# Patient Record
Sex: Male | Born: 1965 | Race: Black or African American | Hispanic: No | Marital: Single | Smoking: Never smoker
Health system: Southern US, Community
[De-identification: ages and names within clinical notes are randomized; demographics above are authoritative.]

## PROBLEM LIST (undated history)

## (undated) DIAGNOSIS — J45909 Unspecified asthma, uncomplicated: Secondary | ICD-10-CM

## (undated) DIAGNOSIS — F32A Depression, unspecified: Secondary | ICD-10-CM

## (undated) DIAGNOSIS — I10 Essential (primary) hypertension: Secondary | ICD-10-CM

## (undated) DIAGNOSIS — G4730 Sleep apnea, unspecified: Secondary | ICD-10-CM

## (undated) DIAGNOSIS — M1990 Unspecified osteoarthritis, unspecified site: Secondary | ICD-10-CM

## (undated) DIAGNOSIS — G43909 Migraine, unspecified, not intractable, without status migrainosus: Secondary | ICD-10-CM

## (undated) DIAGNOSIS — F329 Major depressive disorder, single episode, unspecified: Secondary | ICD-10-CM

## (undated) HISTORY — DX: Sleep apnea, unspecified: G47.30

## (undated) HISTORY — DX: Essential (primary) hypertension: I10

---

## 1999-05-13 ENCOUNTER — Emergency Department (HOSPITAL_COMMUNITY): Admission: EM | Admit: 1999-05-13 | Discharge: 1999-05-13 | Payer: Self-pay | Admitting: Emergency Medicine

## 2000-03-24 HISTORY — PX: UPPER GASTROINTESTINAL ENDOSCOPY: SHX188

## 2000-07-13 ENCOUNTER — Encounter: Payer: Self-pay | Admitting: Emergency Medicine

## 2000-07-13 ENCOUNTER — Emergency Department (HOSPITAL_COMMUNITY): Admission: EM | Admit: 2000-07-13 | Discharge: 2000-07-13 | Payer: Self-pay | Admitting: Emergency Medicine

## 2000-07-17 ENCOUNTER — Emergency Department (HOSPITAL_COMMUNITY): Admission: EM | Admit: 2000-07-17 | Discharge: 2000-07-17 | Payer: Self-pay | Admitting: Emergency Medicine

## 2000-07-17 ENCOUNTER — Encounter: Payer: Self-pay | Admitting: Emergency Medicine

## 2000-07-22 ENCOUNTER — Emergency Department (HOSPITAL_COMMUNITY): Admission: EM | Admit: 2000-07-22 | Discharge: 2000-07-22 | Payer: Self-pay | Admitting: Emergency Medicine

## 2000-07-24 ENCOUNTER — Encounter (INDEPENDENT_AMBULATORY_CARE_PROVIDER_SITE_OTHER): Payer: Self-pay | Admitting: Specialist

## 2000-07-24 ENCOUNTER — Ambulatory Visit (HOSPITAL_COMMUNITY): Admission: RE | Admit: 2000-07-24 | Discharge: 2000-07-24 | Payer: Self-pay | Admitting: Gastroenterology

## 2000-12-04 ENCOUNTER — Ambulatory Visit (HOSPITAL_COMMUNITY): Admission: RE | Admit: 2000-12-04 | Discharge: 2000-12-04 | Payer: Self-pay | Admitting: Gastroenterology

## 2002-07-29 ENCOUNTER — Emergency Department (HOSPITAL_COMMUNITY): Admission: EM | Admit: 2002-07-29 | Discharge: 2002-07-30 | Payer: Self-pay | Admitting: Emergency Medicine

## 2005-06-14 ENCOUNTER — Emergency Department (HOSPITAL_COMMUNITY): Admission: EM | Admit: 2005-06-14 | Discharge: 2005-06-15 | Payer: Self-pay | Admitting: Emergency Medicine

## 2005-10-06 ENCOUNTER — Emergency Department (HOSPITAL_COMMUNITY): Admission: EM | Admit: 2005-10-06 | Discharge: 2005-10-07 | Payer: Self-pay | Admitting: Emergency Medicine

## 2005-12-04 ENCOUNTER — Emergency Department (HOSPITAL_COMMUNITY): Admission: EM | Admit: 2005-12-04 | Discharge: 2005-12-05 | Payer: Self-pay | Admitting: Emergency Medicine

## 2006-01-15 ENCOUNTER — Emergency Department (HOSPITAL_COMMUNITY): Admission: EM | Admit: 2006-01-15 | Discharge: 2006-01-15 | Payer: Self-pay | Admitting: *Deleted

## 2006-04-20 ENCOUNTER — Emergency Department (HOSPITAL_COMMUNITY): Admission: EM | Admit: 2006-04-20 | Discharge: 2006-04-20 | Payer: Self-pay | Admitting: Emergency Medicine

## 2006-07-09 ENCOUNTER — Emergency Department (HOSPITAL_COMMUNITY): Admission: EM | Admit: 2006-07-09 | Discharge: 2006-07-09 | Payer: Self-pay | Admitting: Emergency Medicine

## 2008-10-12 ENCOUNTER — Encounter: Admission: RE | Admit: 2008-10-12 | Discharge: 2008-10-12 | Payer: Self-pay | Admitting: Internal Medicine

## 2009-08-15 ENCOUNTER — Emergency Department (HOSPITAL_COMMUNITY)
Admission: EM | Admit: 2009-08-15 | Discharge: 2009-08-15 | Payer: Self-pay | Source: Home / Self Care | Admitting: Emergency Medicine

## 2010-06-10 LAB — CBC
HCT: 39.9 % (ref 39.0–52.0)
Hemoglobin: 13.7 g/dL (ref 13.0–17.0)
MCHC: 34.3 g/dL (ref 30.0–36.0)
RBC: 4.29 MIL/uL (ref 4.22–5.81)
WBC: 6 10*3/uL (ref 4.0–10.5)

## 2010-06-10 LAB — DIFFERENTIAL
Eosinophils Absolute: 0.1 10*3/uL (ref 0.0–0.7)
Eosinophils Relative: 2 % (ref 0–5)
Lymphocytes Relative: 37 % (ref 12–46)
Lymphs Abs: 2.2 10*3/uL (ref 0.7–4.0)
Monocytes Relative: 8 % (ref 3–12)
Neutrophils Relative %: 53 % (ref 43–77)

## 2010-06-10 LAB — BASIC METABOLIC PANEL
BUN: 10 mg/dL (ref 6–23)
CO2: 29 mEq/L (ref 19–32)
GFR calc Af Amer: >60 mL/min (ref >60)
GFR calc non Af Amer: >60 mL/min (ref >60)
Glucose, Bld: 89 mg/dL (ref 70–99)
Potassium: 3.7 mEq/L (ref 3.5–5.1)

## 2010-06-10 LAB — POCT CARDIAC MARKERS: Troponin i, poc: <0.05 ng/mL (ref 0.00–0.09)

## 2010-08-09 NOTE — Procedures (Signed)
[place]. [facility]  Patient:    Corey Brooks, Corey Brooks                     MRN: [id number] Proc. Date: 07/24/00 Adm. Date:  [id number] Disc. Date: [id number] Attending:  Rich Brave                           Procedure Report  PROCEDURE:  Upper endoscopy with biopsies.  INDICATION:  A 45 year old gentleman who presented to the emergency room two nights ago with a history of melanic stools and weakness.  He was Hemoccult-positive but had a normal BUN and a hemoglobin of 9.4.  He had been on prednisone for asthma plus NSAIDs recently.  FINDINGS:  Active, dirty-based antral ulcer, not bleeding at time of exam.  DESCRIPTION OF PROCEDURE:  The nature, purpose, and risks of the procedure had been discussed with the patient, who provided written consent.  Sedation was fentanyl 50 mcg and Versed 5 mg IV without arrhythmias or desaturation.  The Olympus small-caliber adult video endoscope was passed under direct vision without difficulty.  The vocal cords were briefly seen and appeared grossly normal but were not well-studied.  The esophagus was endoscopically normal, without evidence of reflux esophagitis, Barretts esophagus, varices, infection, or neoplasia.  No ring, stricture, or hiatal hernia was seen.  The stomach was entered.  It contained no blood or coffee-grounds material. In the antrum in the immediate prepyloric region was a 1.5 cm shallow ulcer with a dirty base but no visible vessel.  Adjoining it was edematous, slightly erythematous mucosa.  The overall appearance was very benign.  The remainder of the stomach was normal, including a retroflex view of the proximal stomach.  The pylorus, duodenal bulb, and second duodenum looked normal.  Biopsies were obtained from the antrum prior to removal of the scope to look for evidence of H. pylori infection.  The patient tolerated the procedure well, and there were no  apparent complications.  IMPRESSION:  Active gastric ulcer without evidence of bleeding at this time, but constituting the presumed bleeding source to account for his recent melanic stools and drop in hemoglobin.  PLAN:  Continue Protonix.  Add Carafate for five days.  Await pathology results and treat if H. pylori positive.  Office follow-up as scheduled. DD:  07/24/00 TD:  07/27/00 Job: [id number]

## 2010-08-09 NOTE — Procedures (Signed)
[place]. [facility]  Patient:    Corey Brooks, Corey Brooks Visit Number: [id number] MRN: [id number]          Service Type: Attending:  Florencia Reasons, M.D. Dictated by:   Florencia Reasons, M.D. Proc. Date: 12/04/00   CC:         Sherrie Sport, M.D. [place], [place]   Procedure Report  PROCEDURE:  Upper endoscopy with biopsies.  INDICATIONS FOR PROCEDURE:  The patient is a 45 year old gentleman who is now four months status post an upper gastrointestinal bleed due to an antral ulcer which occurred in association with prednisone and NSAID therapy (the prednisone was for asthma).  At that time, the patient was noted to be H. pylori positive by biopsies, and he underwent treatment for that infection. He is now approximately two months status post completion of that treatment, and is not on any gastrointestinal tract medications, and is free of gastrointestinal symptoms.  FINDINGS:  Normal exam.  CLO test taken.  The nature and purpose for the procedure were familiar to the patient from prior examination.  He provided written consent.  SEDATION: 1. Phentanyl 70 mg IV without arrhythmias or desaturations. 2. Versed 7 mg IV without arrhythmias or desaturations.  DESCRIPTION OF PROCEDURE:  The Olympus small caliber adult video endoscope was passed under direct vision.  The vocal cords were not well seen, but the esophagus was fairly easily entered and had normal mucosa without evidence of reflux, esophagitis, Barretts esophagus, varices, infection, or neoplasia. No rings, stricture, or significant hiatal hernia was appreciated.  The stomach contained a small clear residual, and had entirely normal mucosa.  Specifically, the patients antral ulcer was totally healed, and without any obvious scarring.  No gastritis, erosions, ulcers, polyps, or masses were noted anywhere in the stomach, including a retroflexed view of the proximal stomach, and careful  inspection of the duodenum and bulb were unremarkable.  Antral biopsies were obtained x 2 to check for CLO testing.  This was to assess the ethicacy of his recent therapy for H. pylori infection.  The scope was then removed from the patient who tolerated the procedure well without apparent complication.  IMPRESSION:  Resolution of previous gastric ulcer.  PLAN:  Await CLO test results. Dictated by:   Florencia Reasons, M.D. Attending:  Florencia Reasons, M.D. DD:  12/04/00 TD:  12/04/00 Job: [id number]

## 2010-09-24 ENCOUNTER — Emergency Department (HOSPITAL_COMMUNITY)
Admission: EM | Admit: 2010-09-24 | Discharge: 2010-09-24 | Disposition: A | Discharge status: Home / Self Care | Payer: Self-pay | Attending: Emergency Medicine | Admitting: Emergency Medicine

## 2010-09-24 DIAGNOSIS — G43909 Migraine, unspecified, not intractable, without status migrainosus: Secondary | ICD-10-CM | POA: Insufficient documentation

## 2010-09-24 DIAGNOSIS — J45909 Unspecified asthma, uncomplicated: Secondary | ICD-10-CM | POA: Insufficient documentation

## 2014-11-29 ENCOUNTER — Emergency Department (HOSPITAL_COMMUNITY)
Admission: EM | Admit: 2014-11-29 | Discharge: 2014-11-30 | Disposition: A | Discharge status: Home / Self Care | Payer: 59 | Attending: Emergency Medicine | Admitting: Emergency Medicine

## 2014-11-29 ENCOUNTER — Emergency Department (HOSPITAL_COMMUNITY): Payer: 59

## 2014-11-29 ENCOUNTER — Encounter (HOSPITAL_COMMUNITY): Payer: Self-pay | Admitting: *Deleted

## 2014-11-29 DIAGNOSIS — Z8739 Personal history of other diseases of the musculoskeletal system and connective tissue: Secondary | ICD-10-CM | POA: Insufficient documentation

## 2014-11-29 DIAGNOSIS — Z7951 Long term (current) use of inhaled steroids: Secondary | ICD-10-CM | POA: Diagnosis not present

## 2014-11-29 DIAGNOSIS — R079 Chest pain, unspecified: Secondary | ICD-10-CM

## 2014-11-29 DIAGNOSIS — G43009 Migraine without aura, not intractable, without status migrainosus: Secondary | ICD-10-CM

## 2014-11-29 DIAGNOSIS — R0789 Other chest pain: Secondary | ICD-10-CM | POA: Diagnosis not present

## 2014-11-29 DIAGNOSIS — G43909 Migraine, unspecified, not intractable, without status migrainosus: Secondary | ICD-10-CM | POA: Diagnosis not present

## 2014-11-29 HISTORY — DX: Unspecified osteoarthritis, unspecified site: M19.90

## 2014-11-29 LAB — BASIC METABOLIC PANEL
Anion gap: 7 (ref 5–15)
CALCIUM: 9 mg/dL (ref 8.9–10.3)
CO2: 28 mmol/L (ref 22–32)
Chloride: 102 mmol/L (ref 101–111)
Creatinine, Ser: 1.12 mg/dL (ref 0.61–1.24)
GFR calc Af Amer: >60 mL/min (ref >60)
GLUCOSE: 97 mg/dL (ref 65–99)
POTASSIUM: 3.8 mmol/L (ref 3.5–5.1)
SODIUM: 137 mmol/L (ref 135–145)

## 2014-11-29 LAB — CBC
HEMATOCRIT: 40.9 % (ref 39.0–52.0)
Hemoglobin: 13.5 g/dL (ref 13.0–17.0)
MCH: 30.3 pg (ref 26.0–34.0)
MCHC: 33 g/dL (ref 30.0–36.0)
MCV: 91.7 fL (ref 78.0–100.0)
PLATELETS: 184 10*3/uL (ref 150–400)
RBC: 4.46 MIL/uL (ref 4.22–5.81)
RDW: 13.1 % (ref 11.5–15.5)
WBC: 4.5 10*3/uL (ref 4.0–10.5)

## 2014-11-29 LAB — I-STAT TROPONIN, ED: TROPONIN I, POC: 0 ng/mL (ref 0.00–0.08)

## 2014-11-29 MED ORDER — SUMATRIPTAN SUCCINATE 6 MG/0.5ML ~~LOC~~ SOLN
6.0000 mg | Freq: Once | SUBCUTANEOUS | Status: AC
  Administered 2014-11-29: 6 mg via SUBCUTANEOUS
  Filled 2014-11-29: qty 0.5

## 2014-11-29 NOTE — ED Notes (Signed)
Pt reports chest pain that started yesterday. Pt states that the pain has been worsening since then. States that the pain started while he was sitting. Pt also reports a migraine with hx of same.

## 2014-11-29 NOTE — ED Provider Notes (Signed)
CSN: [id number]     Arrival date & time 11/29/14  1752 History   First MD Initiated Contact with Patient 11/29/14 2138     Chief Complaint  Patient presents with  . Chest Pain  . Migraine     (Consider location/radiation/quality/duration/timing/severity/associated sxs/prior Treatment) HPI Comments: Pt comes in with c/o chest pain that started yesterday. He states that the pain has been consistent. No vomiting or diarrhea, fever or cough.no sob. He states that he is also having a migraine which is causing nausea and dizziness which is similar to his previous migraines. He normally takes imitrex but he is out. Pain in his chest is sharp and left sided. He states that the pain worsened when he was studying for a test.  The history is provided by the patient. No language interpreter was used.    Past Medical History  Diagnosis Date  . Arthritis    History reviewed. No pertinent past surgical history. No family history on file. Social History  Substance Use Topics  . Smoking status: Never Smoker   . Smokeless tobacco: None  . Alcohol Use: None    Review of Systems  All other systems reviewed and are negative.     Allergies  Asa  Home Medications   Prior to Admission medications   Medication Sig Start Date End Date Taking? Authorizing Provider  albuterol (PROVENTIL HFA;VENTOLIN HFA) 108 (90 BASE) MCG/ACT inhaler Inhale 1 puff into the lungs every 6 (six) hours as needed for wheezing or shortness of breath.   Yes Historical Provider, MD  beclomethasone (QVAR) 80 MCG/ACT inhaler Inhale 3 puffs into the lungs 2 (two) times daily.   Yes Historical Provider, MD  SUMAtriptan (IMITREX) 100 MG tablet Take 100 mg by mouth every 2 (two) hours as needed for migraine. May repeat in 2 hours if headache persists or recurs.   Yes Historical Provider, MD   BP 155/104 mmHg  Pulse 61  Temp(Src) 98.4 F (36.9 C) (Oral)  Resp 15  SpO2 99% Physical Exam  Constitutional: He is oriented to  person, place, and time. He appears well-developed and well-nourished.  HENT:  Head: Normocephalic and atraumatic.  Right Ear: External ear normal.  Left Ear: External ear normal.  Eyes: Conjunctivae and EOM are normal. Pupils are equal, round, and reactive to light.  Neck: Normal range of motion.  Cardiovascular: Normal rate and regular rhythm.   Pulmonary/Chest: Effort normal and breath sounds normal.  Left chest wall tender to palpation  Abdominal: Soft. Bowel sounds are normal. There is no tenderness.  Musculoskeletal: Normal range of motion.  Neurological: He is alert and oriented to person, place, and time. Coordination normal.  Skin: Skin is warm and dry.  Nursing note and vitals reviewed.   ED Course  Procedures (including critical care time) Labs Review Labs Reviewed  BASIC METABOLIC PANEL - Abnormal; Notable for the following:    BUN <5 (*)    All other components within normal limits  CBC  I-STAT TROPOININ, ED    Imaging Review Dg Chest 2 View  11/29/2014   CLINICAL DATA:  Headache and chest pain since 11/28/2014.  EXAM: CHEST  2 VIEW  COMPARISON:  CT chest and PA and lateral chest 08/15/2009.  FINDINGS: The lungs are clear. Heart size is normal. No pneumothorax or pleural effusion. No focal bony abnormality.  IMPRESSION: Negative chest.   Electronically Signed   By: Inge Rise M.D.   On: 11/29/2014 19:20   I have personally reviewed and  evaluated these images and lab results as part of my medical decision-making.   EKG Interpretation   Date/Time:  Wednesday November 29 2014 17:56:15 EDT Ventricular Rate:  76 PR Interval:  184 QRS Duration: 92 QT Interval:  390 QTC Calculation: 438 R Axis:   9 Text Interpretation:  Normal sinus rhythm Minimal voltage criteria for  LVH, may be normal variant Nonspecific T wave abnormality Abnormal ECG  Confirmed by MESNER MD, Corene Cornea [phone]) on 11/29/2014 10:25:57 PM      MDM   Final diagnoses:  Chest pain, unspecified  chest pain type  Nonintractable migraine, unspecified migraine type    Pts cp and migraine have resolved with imitrex. Doubt acs in nature. No red flag symptoms with migraine. Pt is okay to follow up with pcp if symptoms return    Glendell Docker, NP 11/29/14 2344  Merrily Pew, MD 11/30/14 9470

## 2014-11-29 NOTE — Discharge Instructions (Signed)
Chest Pain (Nonspecific) °It is often hard to give a specific diagnosis for the cause of chest pain. There is always a chance that your pain could be related to something serious, such as a heart attack or a blood clot in the lungs. You need to follow up with your health care provider for further evaluation. °CAUSES  °· Heartburn. °· Pneumonia or bronchitis. °· Anxiety or stress. °· Inflammation around your heart (pericarditis) or lung (pleuritis or pleurisy). °· A blood clot in the lung. °· A collapsed lung (pneumothorax). It can develop suddenly on its own (spontaneous pneumothorax) or from trauma to the chest. °· Shingles infection (herpes zoster virus). °The chest wall is composed of bones, muscles, and cartilage. Any of these can be the source of the pain. °· The bones can be bruised by injury. °· The muscles or cartilage can be strained by coughing or overwork. °· The cartilage can be affected by inflammation and become sore (costochondritis). °DIAGNOSIS  °Lab tests or other studies may be needed to find the cause of your pain. Your health care provider may have you take a test called an ambulatory electrocardiogram (ECG). An ECG records your heartbeat patterns over a 24-hour period. You may also have other tests, such as: °· Transthoracic echocardiogram (TTE). During echocardiography, sound waves are used to evaluate how blood flows through your heart. °· Transesophageal echocardiogram (TEE). °· Cardiac monitoring. This allows your health care provider to monitor your heart rate and rhythm in real time. °· Holter monitor. This is a portable device that records your heartbeat and can help diagnose heart arrhythmias. It allows your health care provider to track your heart activity for several days, if needed. °· Stress tests by exercise or by giving medicine that makes the heart beat faster. °TREATMENT  °· Treatment depends on what may be causing your chest pain. Treatment may include: °¨ Acid blockers for  heartburn. °¨ Anti-inflammatory medicine. °¨ Pain medicine for inflammatory conditions. °¨ Antibiotics if an infection is present. °· You may be advised to change lifestyle habits. This includes stopping smoking and avoiding alcohol, caffeine, and chocolate. °· You may be advised to keep your head raised (elevated) when sleeping. This reduces the chance of acid going backward from your stomach into your esophagus. °Most of the time, nonspecific chest pain will improve within 2-3 days with rest and mild pain medicine.  °HOME CARE INSTRUCTIONS  °· If antibiotics were prescribed, take them as directed. Finish them even if you start to feel better. °· For the next few days, avoid physical activities that bring on chest pain. Continue physical activities as directed. °· Do not use any tobacco products, including cigarettes, chewing tobacco, or electronic cigarettes. °· Avoid drinking alcohol. °· Only take medicine as directed by your health care provider. °· Follow your health care provider's suggestions for further testing if your chest pain does not go away. °· Keep any follow-up appointments you made. If you do not go to an appointment, you could develop lasting (chronic) problems with pain. If there is any problem keeping an appointment, call to reschedule. °SEEK MEDICAL CARE IF:  °· Your chest pain does not go away, even after treatment. °· You have a rash with blisters on your chest. °· You have a fever. °SEEK IMMEDIATE MEDICAL CARE IF:  °· You have increased chest pain or pain that spreads to your arm, neck, jaw, back, or abdomen. °· You have shortness of breath. °· You have an increasing cough, or you cough   up blood.  You have severe back or abdominal pain.  You feel nauseous or vomit.  You have severe weakness.  You faint.  You have chills. This is an emergency. Do not wait to see if the pain will go away. Get medical help at once. Call your local emergency services (911 in [place] Do not drive  yourself to the hospital. MAKE SURE YOU:   Understand these instructions.  Will watch your condition.  Will get help right away if you are not doing well or get worse. Document Released: 12/18/2004 Document Revised: 03/15/2013 Document Reviewed: 10/14/2007 [facility] Patient Information 2015 [place], [place]. This information is not intended to replace advice given to you by your health care provider. Make sure you discuss any questions you have with your health care provider.  Migraine Headache A migraine headache is very bad, throbbing pain on one or both sides of your head. Talk to your doctor about what things may bring on (trigger) your migraine headaches. HOME CARE  Only take medicines as told by your doctor.  Lie down in a dark, quiet room when you have a migraine.  Keep a journal to find out if certain things bring on migraine headaches. For example, write down:  What you eat and drink.  How much sleep you get.  Any change to your diet or medicines.  Lessen how much alcohol you drink.  Quit smoking if you smoke.  Get enough sleep.  Lessen any stress in your life.  Keep lights dim if bright lights bother you or make your migraines worse. GET HELP RIGHT AWAY IF:   Your migraine becomes really bad.  You have a fever.  You have a stiff neck.  You have trouble seeing.  Your muscles are weak, or you lose muscle control.  You lose your balance or have trouble walking.  You feel like you will pass out (faint), or you pass out.  You have really bad symptoms that are different than your first symptoms. MAKE SURE YOU:   Understand these instructions.  Will watch your condition.  Will get help right away if you are not doing well or get worse. Document Released: 12/18/2007 Document Revised: 06/02/2011 Document Reviewed: 11/15/2012 [facility] Patient Information 2015 [place], [place]. This information is not intended to replace advice given to you by your health care  provider. Make sure you discuss any questions you have with your health care provider.

## 2015-07-05 ENCOUNTER — Emergency Department (HOSPITAL_COMMUNITY)
Admission: EM | Admit: 2015-07-05 | Discharge: 2015-07-07 | Disposition: A | Discharge status: Home / Self Care | Payer: BLUE CROSS/BLUE SHIELD | Attending: Emergency Medicine | Admitting: Emergency Medicine

## 2015-07-05 ENCOUNTER — Encounter (HOSPITAL_COMMUNITY): Payer: Self-pay

## 2015-07-05 DIAGNOSIS — Z79899 Other long term (current) drug therapy: Secondary | ICD-10-CM | POA: Diagnosis not present

## 2015-07-05 DIAGNOSIS — R45851 Suicidal ideations: Secondary | ICD-10-CM | POA: Diagnosis present

## 2015-07-05 DIAGNOSIS — F332 Major depressive disorder, recurrent severe without psychotic features: Secondary | ICD-10-CM | POA: Insufficient documentation

## 2015-07-05 DIAGNOSIS — Z8739 Personal history of other diseases of the musculoskeletal system and connective tissue: Secondary | ICD-10-CM | POA: Diagnosis not present

## 2015-07-05 HISTORY — DX: Depression, unspecified: F32.A

## 2015-07-05 HISTORY — DX: Major depressive disorder, single episode, unspecified: F32.9

## 2015-07-05 LAB — COMPREHENSIVE METABOLIC PANEL
ALT: 15 U/L — AB (ref 17–63)
ANION GAP: 6 (ref 5–15)
AST: 20 U/L (ref 15–41)
Albumin: 3.7 g/dL (ref 3.5–5.0)
Alkaline Phosphatase: 41 U/L (ref 38–126)
BUN: 11 mg/dL (ref 6–20)
CO2: 27 mmol/L (ref 22–32)
Calcium: 8.6 mg/dL — ABNORMAL LOW (ref 8.9–10.3)
Chloride: 105 mmol/L (ref 101–111)
Creatinine, Ser: 1.23 mg/dL (ref 0.61–1.24)
GFR calc non Af Amer: >60 mL/min (ref >60)
Glucose, Bld: 106 mg/dL — ABNORMAL HIGH (ref 65–99)
POTASSIUM: 4.1 mmol/L (ref 3.5–5.1)
SODIUM: 138 mmol/L (ref 135–145)
Total Bilirubin: 0.6 mg/dL (ref 0.3–1.2)
Total Protein: 6.8 g/dL (ref 6.5–8.1)

## 2015-07-05 LAB — CBC
HCT: 40.4 % (ref 39.0–52.0)
HEMOGLOBIN: 13.9 g/dL (ref 13.0–17.0)
MCH: 30.7 pg (ref 26.0–34.0)
MCHC: 34.4 g/dL (ref 30.0–36.0)
MCV: 89.2 fL (ref 78.0–100.0)
PLATELETS: 210 10*3/uL (ref 150–400)
RBC: 4.53 MIL/uL (ref 4.22–5.81)
RDW: 12.9 % (ref 11.5–15.5)
WBC: 6.1 10*3/uL (ref 4.0–10.5)

## 2015-07-05 LAB — ETHANOL

## 2015-07-05 LAB — RAPID URINE DRUG SCREEN, HOSP PERFORMED
AMPHETAMINES: NOT DETECTED
Barbiturates: NOT DETECTED
Benzodiazepines: NOT DETECTED
COCAINE: NOT DETECTED
OPIATES: NOT DETECTED
TETRAHYDROCANNABINOL: NOT DETECTED

## 2015-07-05 LAB — SALICYLATE LEVEL

## 2015-07-05 LAB — ACETAMINOPHEN LEVEL

## 2015-07-05 MED ORDER — LORAZEPAM 1 MG PO TABS: 1.0000 mg | ORAL_TABLET | Freq: Three times a day (TID) | ORAL | Status: DC | PRN

## 2015-07-05 MED ORDER — ONDANSETRON HCL 4 MG PO TABS: 4.0000 mg | ORAL_TABLET | Freq: Three times a day (TID) | ORAL | Status: DC | PRN

## 2015-07-05 MED ORDER — ALUM & MAG HYDROXIDE-SIMETH 200-200-20 MG/5ML PO SUSP: 30.0000 mL | ORAL | Status: DC | PRN

## 2015-07-05 MED ORDER — ACETAMINOPHEN 325 MG PO TABS: 650.0000 mg | ORAL_TABLET | ORAL | Status: DC | PRN

## 2015-07-05 NOTE — BH Assessment (Addendum)
Tele Assessment Note   Corey Brooks is an 50 y.o.single male was referred to [facility] by the [place] at [facility] after he told a lab assistant in one of his classes that he was not feeling well.  Per pt, lab assistant referred pt to [facility] at [facility]. Pt sts he has been increasingly depressed over the last few months since he was laid off from his FT job.  Pt sts that for the last week his depression has been getting worse and over the last week he began to think about killing himself by jumping off the roof of a high building. Pt sts that hs has been under stress from FT work & school for "quite a while."  Pt denies HI, SHI and AH.  Pt sts that at times he feels a "spirit" behind him and sometimes believes he sees something out of the corner of his eye.  Pt sts he often feels as if he is being followed.   Pt sts that his primary stressor currently is school and having been laid off from work.  Pt sts he sleeps on about 4-5 hours per night and prior to being laid off from his job only slept about 2 hours per night. Pt sts "I've been sleep deprived for a very long time."  Symptoms of depression include deep sadness, fatigue, excessive guilt, decreased self esteem, tearfulness & crying spells, self isolation, lack of motivation for activities and pleasure, irritability, negative outlook, difficulty thinking & concentrating, feeling helpless and hopeless, sleep and eating disturbances. Pt denies any symptoms of anxiety stating "I'm usually very calm."  Pt sts he lives alone and although feels supported emotionally by his aging mother he tends to isolate himself from most people. Pt sts his has an older brother has been diagnosed with Schizophrenia many years ago. Pt sts he has no psychiatric hospitalizations, no current psychiatrist or therapist.  Pt sts he did see a therapist in [place] for about 3-4 months in 2003 or 2004 due to depression and he was prescribed Zoloft at that time. Pt sts he is  no longer prescribed any psychiatric medications. Pt denies any past or current legal issues.  Pt denies any alcohol use or recreational drug use. When tested in the [place] tonight pt's BAL is <5 and UDS is incomplete at the time of this assessment. 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 Pt sts he has already completed an Associate's degree. Pt denies an hx of abuse.  Pt sts he performs ADLs independently.   Pt was dressed in scrubs and sitting on his hospital bed. Pt was alert, cooperative and pleasant. Pt kept fair eye contact, spoke in a clear tone and at normal pace. Pt moved in a normal manner when moving. Pt's thought process was coherent and relevant and judgement was impaired.  Pt's mood was stated to be depressed but not anxious and his flat affect was congruent.  Pt was oriented x 4, to person, place, time and situation.   Diagnosis: 296.33  MDD, recurrent, severe  Past Medical History:  Past Medical History  Diagnosis Date  . Arthritis   . Depression     History reviewed. No pertinent past surgical history.  Family History: History reviewed. No pertinent family history.  Social History:  reports that he has never smoked. He does not have any smokeless tobacco history on file. He reports that he does not  drink alcohol. His drug history is not on file.  Additional Social History:  Alcohol / Drug Use Prescriptions: See PTA list History of alcohol / drug use?: No history of alcohol / drug abuse  CIWA: CIWA-Ar BP: (!) 147/105 mmHg Pulse Rate: 90 COWS:    PATIENT STRENGTHS: (choose at least two) Ability for insight Capable of independent living Communication skills Supportive family/friends  Allergies:  Allergies  Allergen Reactions  . Asa [Aspirin] Other (See Comments)    Stomach ulcer    Home Medications:  (Not in a hospital admission)  OB/GYN Status:  No LMP for male patient.  General Assessment  Data Location of Assessment: [facility] ED TTS Assessment: In system Is this a Tele or Face-to-Face Assessment?: Tele Assessment Is this an Initial Assessment or a Re-assessment for this encounter?: Initial Assessment Marital status: Single Maiden name: na Is patient pregnant?: No Pregnancy Status: No Living Arrangements: Alone Can pt return to current living arrangement?: Yes Admission Status: Voluntary Is patient capable of signing voluntary admission?: Yes Referral Source: Other ([place]) Insurance type: [place] Screening Exam ([place]) Medical Exam completed: Yes  Crisis Care Plan Living Arrangements: Alone Name of Psychiatrist: none (once had a psychiatrist in [place]) Name of Therapist: None  Education Status Is patient currently in school?: Yes Current Grade: na Highest grade of school patient has completed: 12 (Associate Degree) Name of school: [employer] person: na  Risk to self with the past 6 months Suicidal Ideation: Yes-Currently Present Has patient been a risk to self within the past 6 months prior to admission? : Yes Suicidal Intent: Yes-Currently Present Has patient had any suicidal intent within the past 6 months prior to admission? : Yes Is patient at risk for suicide?: Yes Suicidal Plan?: Yes-Currently Present Has patient had any suicidal plan within the past 6 months prior to admission? : Yes Specify Current Suicidal Plan: plan to jump off a high roof Access to Means: Yes What has been your use of drugs/alcohol within the last 12 months?: none Previous Attempts/Gestures: No How many times?: 0 Other Self Harm Risks: none (denies) Triggers for Past Attempts: Unpredictable Intentional Self Injurious Behavior: None Family Suicide History: Yes (MH- Brother with Schizophrenia) Recent stressful life event(s): Turmoil (Comment) (Working FT and laid off last month & FT student) Persecutory voices/beliefs?: Yes Depression:  Yes Depression Symptoms: Insomnia, Tearfulness, Isolating, Fatigue, Guilt, Loss of interest in usual pleasures, Feeling worthless/self pity, Feeling angry/irritable Substance abuse history and/or treatment for substance abuse?: No Suicide prevention information given to non-admitted patients: Not applicable  Risk to Others within the past 6 months Homicidal Ideation: No (denies) Does patient have any lifetime risk of violence toward others beyond the six months prior to admission? : No Thoughts of Harm to Others: No (denies) Current Homicidal Intent: No Current Homicidal Plan: No Access to Homicidal Means: No Identified Victim: na History of harm to others?: No (denies) Assessment of Violence: None Noted (denies) Violent Behavior Description: na Does patient have access to weapons?: No (denies) Criminal Charges Pending?: No (denies) Does patient have a court date: No Is patient on probation?: No  Psychosis Hallucinations: Visual (thinks see "spirits" out of corner of eye; follow him) Delusions: None noted  Mental Status Report Appearance/Hygiene: Disheveled, In scrubs Eye Contact: Fair Motor Activity: Freedom of movement, Unremarkable Speech: Logical/coherent, Soft, Slow Level of Consciousness: Quiet/awake Mood: Depressed, Anxious, Pleasant (sts not usually anxious) Affect: Flat, Appropriate to circumstance Anxiety Level: Minimal Thought Processes: Coherent, Relevant  Judgement: Impaired Orientation: Person, Place, Situation, Time Obsessive Compulsive Thoughts/Behaviors: None  Cognitive Functioning Concentration: Poor Memory: Recent Intact, Remote Intact IQ: Average Insight: Fair Impulse Control: Fair Appetite: Fair Weight Loss: 0 (sts appetite has decreased) Weight Gain: 0 Sleep: Decreased (sts due to stress & schedule w work & school) Total Hours of Sleep: 2 (now that laid off sts gets 4-5 hours) Vegetative Symptoms: Decreased grooming, Not bathing  ADLScreening  [facility] Assessment Services) Patient's cognitive ability adequate to safely complete daily activities?: Yes Patient able to express need for assistance with ADLs?: Yes Independently performs ADLs?: Yes (appropriate for developmental age)  Prior Inpatient Therapy Prior Inpatient Therapy: No Prior Therapy Dates: na Prior Therapy Facilty/Provider(s): na Reason for Treatment: na  Prior Outpatient Therapy Prior Outpatient Therapy: Yes Prior Therapy Dates: stopped quite a while ago-  (seeing him in 2003 or 2004- saw her 3-4 mos) Prior Therapy Facilty/Provider(s): Dr. Percell Miller in [facility] Reason for Treatment: Depression Does patient have an ACCT team?: No Does patient have Intensive In-House Services?  : No Does patient have [facility] services? : No Does patient have P4CC services?: No  ADL Screening (condition at time of admission) Patient's cognitive ability adequate to safely complete daily activities?: Yes Patient able to express need for assistance with ADLs?: Yes Independently performs ADLs?: Yes (appropriate for developmental age)       Abuse/Neglect Assessment (Assessment to be complete while patient is alone) Physical Abuse: Denies Verbal Abuse: Denies Sexual Abuse: Denies Exploitation of patient/patient's resources: Denies Self-Neglect: Denies     Regulatory affairs officer (For Healthcare) Does patient have an advance directive?: No Would patient like information on creating an advanced directive?: No - patient declined information    Additional Information 1:1 In Past 12 Months?: No CIRT Risk: No Elopement Risk: No Does patient have medical clearance?: Yes     Disposition:  Disposition Initial Assessment Completed for this Encounter: Yes Disposition of Patient: Other dispositions (Pending review w [facility] Provider or Extender) Other disposition(s): Other (Comment)  Per Dr. Parke Poisson: Recommend pt be re-evaluated by psychiatry in the morning.   Spoke with Dr. Dayna Barker, [place] at  [place] of recommendation. He voiced agreement.   Faylene Kurtz, MS, CRC, [place] Triage Specialist [facility] T 07/05/2015 10:20 PM

## 2015-07-05 NOTE — BHH Counselor (Addendum)
Called to ask if pt medically cleared for [facility] Assessment (no provider note in EPIC) & to possibly set up TA.  Nurse stated she was not sure if medically cleared.  Asked that TTS Consult order be discontinued until pt is medically cleared. She advised she would consult EDP.   Faylene Kurtz, MS, CRC, [place] Triage Specialist [facility]

## 2015-07-05 NOTE — ED Notes (Signed)
Pt has been in school taking a full load, he's not been on any depression medication and began to get depressed and suicidal, he wanted to jump off the roof, he finally told someone and they referred him here and said if he got help they would reinstate his financial aid and allow him back in school. Pt said he was taking zoloft before and thought it helped

## 2015-07-05 NOTE — ED Provider Notes (Signed)
CSN: [id number]     Arrival date & time 07/05/15  1818 History   First MD Initiated Contact with Patient 07/05/15 2011     Chief Complaint  Patient presents with  . Depression  . Suicidal     (Consider location/radiation/quality/duration/timing/severity/associated sxs/prior Treatment) Patient is a 50 y.o. male presenting with depression.  Depression This is a recurrent problem. The current episode started more than 1 week ago. The problem occurs constantly. The problem has been gradually worsening. Pertinent negatives include no chest pain, no headaches and no shortness of breath. Nothing aggravates the symptoms. Nothing relieves the symptoms. He has tried nothing for the symptoms. The treatment provided no relief.    Past Medical History  Diagnosis Date  . Arthritis   . Depression    History reviewed. No pertinent past surgical history. History reviewed. No pertinent family history. Social History  Substance Use Topics  . Smoking status: Never Smoker   . Smokeless tobacco: None  . Alcohol Use: No    Review of Systems  Constitutional: Negative for chills.  Respiratory: Negative for chest tightness and shortness of breath.   Cardiovascular: Negative for chest pain.  Endocrine: Negative for polydipsia and polyuria.  Genitourinary: Negative for dysuria and flank pain.  Neurological: Negative for headaches.  Psychiatric/Behavioral: Positive for depression and suicidal ideas. Negative for behavioral problems, confusion and self-injury.  All other systems reviewed and are negative.     Allergies  Asa  Home Medications   Prior to Admission medications   Medication Sig Start Date End Date Taking? Authorizing Provider  albuterol (PROVENTIL HFA;VENTOLIN HFA) 108 (90 BASE) MCG/ACT inhaler Inhale 1 puff into the lungs every 6 (six) hours as needed for wheezing or shortness of breath.   Yes Historical Provider, MD  beclomethasone (QVAR) 80 MCG/ACT inhaler Inhale 3 puffs into the  lungs 2 (two) times daily.   Yes Historical Provider, MD  hydrochlorothiazide (HYDRODIURIL) 12.5 MG tablet Take 12.5 mg by mouth daily.   Yes Historical Provider, MD  loratadine (CLARITIN) 10 MG tablet Take 10 mg by mouth daily.   Yes Historical Provider, MD  montelukast (SINGULAIR) 10 MG tablet Take 10 mg by mouth daily.   Yes Historical Provider, MD  SUMAtriptan (IMITREX) 100 MG tablet Take 100 mg by mouth every 2 (two) hours as needed for migraine. May repeat in 2 hours if headache persists or recurs.   Yes Historical Provider, MD   BP 147/105 mmHg  Pulse 90  Temp(Src) 98.7 F (37.1 C) (Oral)  Resp 16  SpO2 95% Physical Exam  Constitutional: He is oriented to person, place, and time. He appears well-developed and well-nourished.  HENT:  Head: Normocephalic and atraumatic.  Neck: Normal range of motion.  Cardiovascular: Normal rate.   Pulmonary/Chest: Effort normal. No respiratory distress.  Abdominal: Soft. He exhibits no distension. There is no tenderness.  Musculoskeletal: Normal range of motion.  Neurological: He is alert and oriented to person, place, and time. No cranial nerve deficit.  Skin: Skin is warm and dry.  Psychiatric: He expresses suicidal ideation. He expresses suicidal plans.  Nursing note and vitals reviewed.   ED Course  Procedures (including critical care time) Labs Review Labs Reviewed  COMPREHENSIVE METABOLIC PANEL - Abnormal; Notable for the following:    Glucose, Bld 106 (*)    Calcium 8.6 (*)    ALT 15 (*)    All other components within normal limits  ACETAMINOPHEN LEVEL - Abnormal; Notable for the following:    Acetaminophen (Tylenol),  Serum <10 (*)    All other components within normal limits  ETHANOL  SALICYLATE LEVEL  CBC  URINE RAPID DRUG SCREEN, HOSP PERFORMED    Imaging Review No results found. I have personally reviewed and evaluated these images and lab results as part of my medical decision-making.   EKG Interpretation None       MDM   Final diagnoses:  None   No medical issues. Worsening depression and suicidal thoughts/ideation with a plan.  Medically cleared for TTS consultation.      Merrily Pew, MD 07/05/15 [phone]

## 2015-07-06 DIAGNOSIS — F332 Major depressive disorder, recurrent severe without psychotic features: Secondary | ICD-10-CM

## 2015-07-06 MED ORDER — SERTRALINE HCL 50 MG PO TABS
50.0000 mg | ORAL_TABLET | Freq: Every day | ORAL | Status: DC
  Administered 2015-07-06 – 2015-07-07 (×2): 50 mg via ORAL
  Filled 2015-07-06 (×2): qty 1

## 2015-07-06 MED ORDER — HYDROCHLOROTHIAZIDE 12.5 MG PO CAPS
12.5000 mg | ORAL_CAPSULE | Freq: Every day | ORAL | Status: DC
  Administered 2015-07-06 – 2015-07-07 (×2): 12.5 mg via ORAL
  Filled 2015-07-06 (×3): qty 1

## 2015-07-06 NOTE — ED Notes (Signed)
Psychiatry at bedside.

## 2015-07-06 NOTE — ED Notes (Signed)
Pt belongings moved to locker number 35

## 2015-07-06 NOTE — BHH Counselor (Signed)
This Probation officer submitted supporting documentation to the following psychiatric facilities for patient placement: [facility], [facility], [facility], [facility], [place]

## 2015-07-06 NOTE — ED Notes (Signed)
Patient noted in room. No complaints, stable, in no acute distress. Q15 minute rounds and monitoring via [employer] Cam[employer] to continue.  

## 2015-07-06 NOTE — ED Notes (Addendum)
Pt has a blunt affect and appears depressed this am. He speaks in a soft voice and told the writer he is taking courses at [employer] to decide if he wants to go into medicine. Pt remains with a sitter.he has not been able to sleep due to the pt next to him being very loud and verbal all night. Pt remains with a sitter and is pleasant and cooperative. (10:30am )

## 2015-07-06 NOTE — Consult Note (Signed)
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 Brooks patient admitted with Depression, suicidal ideation  HPI:  Corey Brooks, 50 years old was evaluated for increased feelings of depression and suicidal ideation with plan to jump off the roof.  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  He is a full time student and works full time too.  Patient was seen for Depression in 2003-2004 and was placed on Zoloft.  Patient states he was taking Zoloft but stopped when he started feeling better.  Patient states that he started feeling depressed again and started feeling suicidal in the past one week.  Patient reports poor sleep and appetite.  He has been accepted for admission and we will be seeking placement at any facility with available bed.  Past Psychiatric History: MDD  Risk to Self: Suicidal Ideation: Yes-Currently Present Suicidal Intent: Yes-Currently Present Is patient at risk for suicide?: Yes Suicidal Plan?: Yes-Currently Present Specify Current Suicidal Plan: plan to jump off a high roof Access to Means: Yes What has been your use of drugs/alcohol within the last 12 months?: none How many times?: 0 Other Self Harm Risks: none (denies) Triggers for Past Attempts: Unpredictable Intentional Self Injurious Behavior: None Risk to Others: Homicidal Ideation: No (denies) Thoughts of Harm to Others: No (denies) Current Homicidal Intent: No Current Homicidal Plan: No Access to Homicidal Means: No Identified Victim: na History of harm to others?: No  (denies) Assessment of Violence: None Noted (denies) Violent Behavior Description: na Does patient have access to weapons?: No (denies) Criminal Charges Pending?: No (denies) Does patient have a court date: No Prior Inpatient Therapy: Prior Inpatient Therapy: No Prior Therapy Dates: na Prior Therapy Facilty/Provider(s): na Reason for Treatment: na Prior Outpatient Therapy: Prior Outpatient Therapy: Yes Prior Therapy Dates: stopped quite a while ago-  (seeing him in 2003 or 2004- saw her 3-4 mos) Prior Therapy Facilty/Provider(s): Dr. Percell Miller in [facility] Reason for Treatment: Depression Does patient have an ACCT team?: No Does patient have Intensive In-House Services?  : No Does patient have [facility] services? : No Does patient have P4CC services?: No  Past Medical History:  Past Medical History  Diagnosis Date  . Arthritis   . Depression    History reviewed. No pertinent past surgical history. Family History: History reviewed. No pertinent family history.   Family Psychiatric  History: Denies  Social History:  History  Alcohol Use No     History  Drug Use Not on file    Social History   Social History  . Marital Status: Single    Spouse Name: N/A  . Number of Children: N/A  . Years of Education: N/A   Social History Main Topics  . Smoking status: Never Smoker   . Smokeless tobacco: None  . Alcohol Use: No  . Drug Use: None  . Sexual Activity: Not Asked   Other Topics Concern  . None   Social History Narrative   Additional Social History:    Allergies:   Allergies  Allergen Reactions  . Asa [Aspirin] Other (See Comments)    Stomach ulcer  Labs:  Results for orders placed or performed during the hospital encounter of 07/05/15 (from the past 48 hour(s))  Comprehensive metabolic panel     Status: Abnormal   Collection Time: 07/05/15  7:59 PM  Result Value Ref Range   Sodium 138 135 - 145 mmol/L   Potassium 4.1 3.5 - 5.1 mmol/L    Comment: SLIGHT  HEMOLYSIS   Chloride 105 101 - 111 mmol/L   CO2 27 22 - 32 mmol/L   Glucose, Bld 106 (H) 65 - 99 mg/dL   BUN 11 6 - 20 mg/dL   Creatinine, Ser 1.23 0.61 - 1.24 mg/dL   Calcium 8.6 (L) 8.9 - 10.3 mg/dL   Total Protein 6.8 6.5 - 8.1 g/dL   Albumin 3.7 3.5 - 5.0 g/dL   AST 20 15 - 41 U/L   ALT 15 (L) 17 - 63 U/L   Alkaline Phosphatase 41 38 - 126 U/L   Total Bilirubin 0.6 0.3 - 1.2 mg/dL   GFR calc non Af Amer >60 >60 mL/min   GFR calc Af Amer >60 >60 mL/min    Comment: (NOTE) The eGFR has been calculated using the CKD EPI equation. This calculation has not been validated in all clinical situations. eGFR's persistently <60 mL/min signify possible Chronic Kidney Disease.    Anion gap 6 5 - 15  Ethanol (ETOH)     Status: None   Collection Time: 07/05/15  7:59 PM  Result Value Ref Range   Alcohol, Ethyl (B) <5 <5 mg/dL    Comment:        LOWEST DETECTABLE LIMIT FOR SERUM ALCOHOL IS 5 mg/dL FOR MEDICAL PURPOSES ONLY   Salicylate level     Status: None   Collection Time: 07/05/15  7:59 PM  Result Value Ref Range   Salicylate Lvl <3.5 2.8 - 30.0 mg/dL  Acetaminophen level     Status: Abnormal   Collection Time: 07/05/15  7:59 PM  Result Value Ref Range   Acetaminophen (Tylenol), Serum <10 (L) 10 - 30 ug/mL    Comment:        THERAPEUTIC CONCENTRATIONS VARY SIGNIFICANTLY. A RANGE OF 10-30 ug/mL MAY BE AN EFFECTIVE CONCENTRATION FOR MANY PATIENTS. HOWEVER, SOME ARE BEST TREATED AT CONCENTRATIONS OUTSIDE THIS RANGE. ACETAMINOPHEN CONCENTRATIONS >150 ug/mL AT 4 HOURS AFTER INGESTION AND >50 ug/mL AT 12 HOURS AFTER INGESTION ARE OFTEN ASSOCIATED WITH TOXIC REACTIONS.   CBC     Status: None   Collection Time: 07/05/15  7:59 PM  Result Value Ref Range   WBC 6.1 4.0 - 10.5 K/uL   RBC 4.53 4.22 - 5.81 MIL/uL   Hemoglobin 13.9 13.0 - 17.0 g/dL   HCT 40.4 39.0 - 52.0 %   MCV 89.2 78.0 - 100.0 fL   MCH 30.7 26.0 - 34.0 pg   MCHC 34.4 30.0 - 36.0 g/dL   RDW 12.9 11.5 - 15.5  %   Platelets 210 150 - 400 K/uL  Urine rapid drug screen (hosp performed) (Not at [facility])     Status: None   Collection Time: 07/05/15  9:38 PM  Result Value Ref Range   Opiates NONE DETECTED NONE DETECTED   Cocaine NONE DETECTED NONE DETECTED   Benzodiazepines NONE DETECTED NONE DETECTED   Amphetamines NONE DETECTED NONE DETECTED   Tetrahydrocannabinol NONE DETECTED NONE DETECTED   Barbiturates NONE DETECTED NONE DETECTED    Comment:        DRUG SCREEN FOR MEDICAL PURPOSES ONLY.  IF CONFIRMATION IS NEEDED FOR  ANY PURPOSE, NOTIFY LAB WITHIN 5 DAYS.        LOWEST DETECTABLE LIMITS FOR URINE DRUG SCREEN Drug Class       Cutoff (ng/mL) Amphetamine      1000 Barbiturate      200 Benzodiazepine   251 Tricyclics       898 Opiates          300 Cocaine          300 THC              50     Current Facility-Administered Medications  Medication Dose Route Frequency Provider Last Rate Last Dose  . acetaminophen (TYLENOL) tablet 650 mg  650 mg Oral Q4H PRN Merrily Pew, MD      . alum & mag hydroxide-simeth (MAALOX/MYLANTA) 200-200-20 MG/5ML suspension 30 mL  30 mL Oral PRN Merrily Pew, MD      . hydrochlorothiazide (MICROZIDE) capsule 12.5 mg  12.5 mg Oral Daily Forde Dandy, MD   12.5 mg at 07/06/15 1450  . ondansetron (ZOFRAN) tablet 4 mg  4 mg Oral Q8H PRN Merrily Pew, MD      . sertraline (ZOLOFT) tablet 50 mg  50 mg Oral Daily Chieko Neises, MD   50 mg at 07/06/15 1450   Current Outpatient Prescriptions  Medication Sig Dispense Refill  . albuterol (PROVENTIL HFA;VENTOLIN HFA) 108 (90 BASE) MCG/ACT inhaler Inhale 1 puff into the lungs every 6 (six) hours as needed for wheezing or shortness of breath.    . beclomethasone (QVAR) 80 MCG/ACT inhaler Inhale 3 puffs into the lungs 2 (two) times daily.    . hydrochlorothiazide (HYDRODIURIL) 12.5 MG tablet Take 12.5 mg by mouth daily.    Marland Kitchen loratadine (CLARITIN) 10 MG tablet Take 10 mg by mouth daily.    . montelukast (SINGULAIR) 10 MG  tablet Take 10 mg by mouth daily.    . SUMAtriptan (IMITREX) 100 MG tablet Take 100 mg by mouth every 2 (two) hours as needed for migraine. May repeat in 2 hours if headache persists or recurs.      Musculoskeletal: Strength & Muscle Tone: within normal limits Gait & Station: normal Patient leans: N/A  Psychiatric Specialty Exam: Review of Systems  Constitutional: Negative.   HENT: Negative.   Eyes: Negative.   Respiratory: Negative.   Cardiovascular: Negative.   Gastrointestinal: Negative.   Genitourinary: Negative.   Musculoskeletal: Negative.   Skin: Negative.   Neurological: Negative.   Endo/Heme/Allergies: Negative.     Blood pressure 155/116, pulse 69, temperature 98 F (36.7 C), temperature source Oral, resp. rate 18, SpO2 98 %.There is no height or weight on file to calculate BMI.  General Appearance: Casual and Fairly Groomed  Eye Contact::  Good  Speech:  Clear and Coherent and Normal Rate  Volume:  Normal  Mood:  Anxious and Depressed  Affect:  Congruent and Depressed  Thought Process:  Coherent and Goal Directed  Orientation:  Full (Time, Place, and Person)  Thought Content:  WDL  Suicidal Thoughts:  No  Homicidal Thoughts:  No  Memory:  Immediate;   Good Recent;   Good Remote;   Good  Judgement:  Good  Insight:  Good  Psychomotor Activity:  Psychomotor Retardation  Concentration:  Good  Recall:  NA  Fund of Knowledge:Good  Language: Good  Akathisia:  NA  Handed:  Right  AIMS (if indicated):     Assets:  Desire for Improvement  ADL's:  Intact  Cognition: WNL  Sleep:      Treatment Plan Summary: Daily contact with patient to assess and evaluate symptoms and progress in treatment and Medication management  Disposition: Accepted for admission and we will be seeking placement at any facility with available bed.  We restarted his Zoloft at 50 mg po daily for depression.  We will offer the rest of our PRN medications as needed.  Delfin Gant, NP    PMHNP-BC 07/06/2015 3:44 PM Patient seen face-to-face for psychiatric evaluation, chart reviewed and case discussed with the physician extender and developed treatment plan. Reviewed the information documented and agree with the treatment plan. Corena Pilgrim, MD

## 2015-07-06 NOTE — BH Assessment (Signed)
Pt accepted at [facility] and may arrive 4.15.17 after 0830. Accepting physician is Dr. Mina Marble.

## 2015-07-06 NOTE — ED Notes (Signed)
Pt oriented to room and unit.  Pt is irritable about moving to locked unit.  Pt denies S/I and H/I, and AVH at this time.  15 minute checks and video monitoring in place.

## 2015-07-06 NOTE — ED Notes (Signed)
In depth explanation of POC.  Patient receptive to plan.  Currently denies SI but does affirm "depression and feeling overwhelmed."  Support offered.

## 2015-07-06 NOTE — ED Notes (Signed)
Report received from [place]. Patient alert and oriented, warm and dry, in no acute distress. Patient denies SI, HI, AVH and pain. Patient made aware of Q15 minute rounds and security cameras for their safety. Patient instructed to come to me with needs or concerns.

## 2015-07-06 NOTE — ED Notes (Signed)
Patient noted sleeping in room. No complaints, stable, in no acute distress. Q15 minute rounds and monitoring via Security Cameras to continue.  

## 2015-07-07 MED ORDER — SERTRALINE HCL 50 MG PO TABS: 50.0000 mg | ORAL_TABLET | Freq: Every day | ORAL | Status: DC

## 2015-07-07 NOTE — ED Notes (Signed)
Up to the bathroom 

## 2015-07-07 NOTE — ED Notes (Signed)
Patient noted sleeping in room. No complaints, stable, in no acute distress. Q15 minute rounds and monitoring via Security Cameras to continue.  

## 2015-07-07 NOTE — ED Notes (Signed)
Dr Loni Muse and josephine NP into see. Pt to be dc'd home

## 2015-07-07 NOTE — ED Notes (Signed)
Pt will arrainge for OP psych follow up thru his PMD when he sees him on Monday.  Pt ambulatory w/o difficulty to dc area with mHt.  Belongings returned after leaving the area.

## 2015-07-07 NOTE — ED Notes (Addendum)
Written dc instructions and prescription reviewed with pt.  Pt encouraged to take medications as directed and follow up with his MD this week.  Pt denies si/hi/avh at this time and was encouraged to return or seek treatment for suicidal/homicidal thoughts or urges.  Pt verbalized understanding.

## 2015-07-07 NOTE — [facility] Suicide Risk Assessment (Cosign Needed)
Suicide Risk Assessment  Discharge Assessment   [facility] Discharge Suicide Risk Assessment   Principal Problem: Severe recurrent major depression [facility]) Discharge Diagnoses:  Patient Active Problem List   Diagnosis Date Noted  . 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 rate 20, SpO2 98 %.There is no height or weight on file to calculate BMI.  Eye Contact:: Good  Speech: Clear and Coherent and Normal Rate  Volume: Normal  Mood: Depressed  Affect: Congruent and Depressed  Thought Process: Coherent and Goal Directed  Orientation: Full (Time, Place, and Person)  Thought Content: WDL  Suicidal Thoughts: No  Homicidal Thoughts: No  Memory: Immediate; Good Recent; Good Remote; Good  Judgement: Good  Insight: Good  Psychomotor Activity: Normal  Concentration: Good  Recall: NA  Fund of Knowledge:Good  Language: Good  Akathisia: NA  Handed: Right  AIMS (if indicated):    Assets: Desire for Improvement  ADL's: Intact  Cognition: WNL     Mental Status Per Nursing Assessment::   On Admission:     Demographic Factors:  Male, Low socioeconomic status and Living alone  Loss Factors: NA  Historical Factors: NA  Risk Reduction Factors:   Sense of responsibility to family and Positive coping skills or problem solving skills  Continued Clinical Symptoms:  Depression:   Insomnia  Cognitive Features That Contribute To Risk:  Polarized thinking    Suicide Risk:  Minimal: No identifiable suicidal ideation.  Patients presenting with no risk factors but with morbid ruminations; may be classified as minimal risk based on the severity of  the depressive symptoms  Follow-up Information    Follow up with [facility]. Schedule an appointment as soon as possible for a visit in 2 days.   Why:  follow up with new appointment due to missed appoitment that was already scheduled.   Contact information:   [address] [place] [address] [place] Of Care/Follow-up recommendations:  Activity:  As tolerated Diet:  Regular  Delfin Gant, NP    PMHNP-BC  07/07/2015, 12:08 PM

## 2015-07-07 NOTE — BH Assessment (Signed)
Pt RN, Dominica Severin informed of pt acceptance to [facility].

## 2015-07-07 NOTE — ED Notes (Signed)
Talking with TTS in hall

## 2015-07-07 NOTE — ED Notes (Signed)
Visitor into see

## 2015-07-07 NOTE — ED Notes (Signed)
Up on the phone 

## 2015-07-07 NOTE — Consult Note (Signed)
Psychiatric Specialty Exam: Physical Exam  ROS  Blood pressure 138/100, pulse 64, temperature 97.9 F (36.6 C), temperature source Oral, resp. rate 20, SpO2 98 %.There is no height or weight on file to calculate BMI.  Eye Contact:: Good  Speech: Clear and Coherent and Normal Rate  Volume: Normal  Mood: Depressed  Affect: Congruent and Depressed  Thought Process: Coherent and Goal Directed  Orientation: Full (Time, Place, and Person)  Thought Content: WDL  Suicidal Thoughts: No  Homicidal Thoughts: No  Memory: Immediate; Good Recent; Good Remote; Good  Judgement: Good  Insight: Good  Psychomotor Activity: Normal  Concentration: Good  Recall: NA  Fund of Knowledge:Good  Language: Good  Akathisia: NA  Handed: Right  AIMS (if indicated):    Assets: Desire for Improvement  ADL's: Intact  Cognition: WNL         Patient was seen this morning with much better and improved mood.  Patient declined admission to [facility]  Patient has agreed to follow up with his primary Care Physician who will refer him a Psychiatrist.  Patient denies SI/HI/AVH. Patient is discharged home.  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 Reviewed the information documented and agree with the treatment plan. Corena Pilgrim, MD

## 2016-12-10 ENCOUNTER — Encounter (HOSPITAL_COMMUNITY): Payer: Self-pay | Admitting: Emergency Medicine

## 2016-12-10 ENCOUNTER — Emergency Department (HOSPITAL_COMMUNITY)
Admission: EM | Admit: 2016-12-10 | Discharge: 2016-12-10 | Disposition: A | Discharge status: Home / Self Care | Payer: BLUE CROSS/BLUE SHIELD | Attending: Emergency Medicine | Admitting: Emergency Medicine

## 2016-12-10 ENCOUNTER — Emergency Department (HOSPITAL_COMMUNITY): Payer: Self-pay

## 2016-12-10 DIAGNOSIS — Z79899 Other long term (current) drug therapy: Secondary | ICD-10-CM | POA: Insufficient documentation

## 2016-12-10 DIAGNOSIS — J45909 Unspecified asthma, uncomplicated: Secondary | ICD-10-CM | POA: Insufficient documentation

## 2016-12-10 DIAGNOSIS — R0602 Shortness of breath: Secondary | ICD-10-CM

## 2016-12-10 DIAGNOSIS — R0789 Other chest pain: Secondary | ICD-10-CM

## 2016-12-10 HISTORY — DX: Migraine, unspecified, not intractable, without status migrainosus: G43.909

## 2016-12-10 HISTORY — DX: Unspecified asthma, uncomplicated: J45.909

## 2016-12-10 LAB — BASIC METABOLIC PANEL
Anion gap: 6 (ref 5–15)
BUN: 10 mg/dL (ref 6–20)
CALCIUM: 9 mg/dL (ref 8.9–10.3)
CO2: 28 mmol/L (ref 22–32)
CREATININE: 1.2 mg/dL (ref 0.61–1.24)
Chloride: 103 mmol/L (ref 101–111)
GFR calc non Af Amer: >60 mL/min (ref >60)
GLUCOSE: 107 mg/dL — AB (ref 65–99)
Potassium: 3.7 mmol/L (ref 3.5–5.1)
Sodium: 137 mmol/L (ref 135–145)

## 2016-12-10 LAB — POCT I-STAT TROPONIN I
Troponin i, poc: 0 ng/mL (ref 0.00–0.08)
Troponin i, poc: 0 ng/mL (ref 0.00–0.08)

## 2016-12-10 LAB — CBC
HCT: 39.3 % (ref 39.0–52.0)
Hemoglobin: 13.4 g/dL (ref 13.0–17.0)
MCH: 30.4 pg (ref 26.0–34.0)
MCHC: 34.1 g/dL (ref 30.0–36.0)
MCV: 89.1 fL (ref 78.0–100.0)
Platelets: 195 10*3/uL (ref 150–400)
RBC: 4.41 MIL/uL (ref 4.22–5.81)
RDW: 12.8 % (ref 11.5–15.5)
WBC: 4.4 10*3/uL (ref 4.0–10.5)

## 2016-12-10 NOTE — ED Provider Notes (Signed)
[place] DEPT Provider Note   CSN: [id number] Arrival date & time: 12/10/16  1052     History   Chief Complaint Chief Complaint  Patient presents with  . Chest Pain  . Shortness of Breath    HPI Corey Brooks is a 51 y.o. male with history of seasonal asthma, migraines, and HTN controlled with HCTZwho presents a with chief complaint acute onset, progressively improving right sided chest pain for 3 days. He states that pain is achy in nature and does not radiate. Pain worsens with activity and resolves with rest. He also endorses mild shortness of breath with activity which also resolves with rest. He denies palpitations, leg swelling, lightheadedness, abdominal pain, nausea, or vomiting. Has not tried anything for his symptoms. He did go to student health yesterday who performed an EKG and recommended he present to the ED but he was unable to, until today. He is a nonsmoker, does not drink alcohol,and denies any recent travel or surgeries, no hemoptysis, no prior history of DVT or PE, no history of cancer, not on testosterone therapy. No cough or wheezing. 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  The history is provided by the patient.    Past Medical History:  Diagnosis Date  . Arthritis   . Asthma   . Depression   . Migraines     Patient Active Problem List   Diagnosis Date Noted  . 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 No pertinent surgical history.     Home Medications    Prior to Admission medications   Medication Sig Start Date End Date Taking? Authorizing Provider  budesonide-formoterol (SYMBICORT) 80-4.5 MCG/ACT inhaler Inhale 2 puffs into the lungs 2 (two) times daily.   Yes [provider]  hydrochlorothiazide (HYDRODIURIL) 12.5 MG tablet Take 12.5 mg by mouth daily.   Yes [provider]  sertraline  (ZOLOFT) 100 MG tablet Take 100 mg by mouth daily.   Yes [provider]  SUMAtriptan (IMITREX) 100 MG tablet Take 100 mg by mouth every 2 (two) hours as needed for migraine. May repeat in 2 hours if headache persists or recurs.   Yes [provider]  sertraline (ZOLOFT) 50 MG tablet Take 1 tablet (50 mg total) by mouth daily. Patient not taking: Reported on 12/10/2016 07/07/15   Delfin Gant, NP    Family History No family history on file.  Social History Social History  Substance Use Topics  . Smoking status: Never Smoker  . Smokeless tobacco: Never Used  . Alcohol use No     Allergies   Asa [aspirin] and Caffeine   Review of Systems Review of Systems  Constitutional: Negative for chills and fever.  Respiratory: Positive for shortness of breath. Negative for cough and wheezing.   Cardiovascular: Positive for chest pain. Negative for palpitations and leg swelling.  Gastrointestinal: Negative for abdominal pain, nausea and vomiting.  Neurological: Negative for weakness, light-headedness, numbness and headaches.  All other systems reviewed and are negative.    Physical Exam Updated Vital Signs BP (!) 151/108 (BP Location: Left Arm)   Pulse 97   Temp 98.7 F (37.1 C) (Oral)   Resp 11   Ht 6\' 3"  (1.905 m)   Wt 127 kg (280 lb)   SpO2 100%   BMI 35.00 kg/m   Physical Exam  Constitutional: He is oriented to person, place, and  time. He appears well-developed and well-nourished. No distress.  Resting comfortably in bed  HENT:  Head: Normocephalic and atraumatic.  Eyes: Conjunctivae are normal. Right eye exhibits no discharge. Left eye exhibits no discharge.  Neck: Normal range of motion. Neck supple. No JVD present. No tracheal deviation present.  Cardiovascular: Normal rate, regular rhythm, normal heart sounds and intact distal pulses.  Exam reveals no gallop and no friction rub.   No murmur heard. 2+ radial and DP/PT pulses bl, negative Homan's  bl, no lower extremity edema  Pulmonary/Chest: Effort normal and breath sounds normal. No respiratory distress. He has no wheezes. He has no rales. He exhibits tenderness.  Right anterior chest wall tender to palpation overlying the pectoralis muscle and insertion to the axilla. Equal rise and fall of chest, no increased work of breathing, no paradoxical wall motion, no ecchymosis or crepitus.  Abdominal: Soft. Bowel sounds are normal. He exhibits no distension. There is no tenderness.  Musculoskeletal: Normal range of motion. He exhibits no edema or tenderness.  Neurological: He is alert and oriented to person, place, and time.  Skin: Skin is warm and dry. No erythema.  Psychiatric: He has a normal mood and affect. His behavior is normal.  Nursing note and vitals reviewed.    ED Treatments / Results  Labs (all labs ordered are listed, but only abnormal results are displayed) Labs Reviewed  BASIC METABOLIC PANEL - Abnormal; Notable for the following:       Result Value   Glucose, Bld 107 (*)    All other components within normal limits  CBC  I-STAT TROPONIN, ED  POCT I-STAT TROPONIN I  I-STAT TROPONIN, ED  POCT I-STAT TROPONIN I    EKG  EKG Interpretation  Date/Time:  Wednesday December 10 2016 11:01:34 EDT Ventricular Rate:  84 PR Interval:    QRS Duration: 93 QT Interval:  357 QTC Calculation: 422 R Axis:   22 Text Interpretation:  Sinus rhythm Abnormal R-wave progression, early transition Borderline T wave abnormalities Similar morphology in lateral leads and anterior leads to prior  No significant change since last tracing Confirmed by Gareth Morgan [phone]) on 12/10/2016 6:46:37 PM       Radiology Dg Chest 2 View  Result Date: 12/10/2016 CLINICAL DATA:  Right-sided chest pain and shortness breath for 3 days. EXAM: CHEST  2 VIEW COMPARISON:  11/29/2014 FINDINGS: The heart size and mediastinal contours are within normal limits. Both lungs are clear. The visualized  skeletal structures are unremarkable. IMPRESSION: No active cardiopulmonary disease. Electronically Signed   By: Earle Gell M.D.   On: 12/10/2016 11:38    Procedures Procedures (including critical care time)  Medications Ordered in ED Medications - No data to display   Initial Impression / Assessment and Plan / ED Course  I have reviewed the triage vital signs and the nursing notes.  Pertinent labs & imaging results that were available during my care of the patient were reviewed by me and considered in my medical decision making (see chart for details).     Patient with right-sided chest pain reproducible on palpation and mild dyspnea on exertion.afebrile, vital signs are at patient's sign. EKG shows no significant change from last tracing with no ST segment abnormalities or arrhythmia. Chest x-ray shows no active cardiopulmonary disease. Serial troponins are negative. He has a HEART score of 1 and I doubt ACS or MI contributing to his symptoms. He is PERC negative and low risk per well's criteria for PE.no evidence of  pericarditis myocarditis, pneumonia, or pulmonary edema. Chest pain is reproduced on palpation which is suggestive of musculoskeletal etiology of his symptoms. 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 Stable for discharge home with RICE therapy, follow-up with primary care physician for reevaluation, and strict ED return precautions. Pt verbalized understanding of and agreement with plan and is safe for discharge home at this time.   Final Clinical Impressions(s) / ED Diagnoses   Final diagnoses:  Chest wall pain  Shortness of breath    New Prescriptions Discharge Medication List as of 12/10/2016  8:08 PM       Renita Papa, PA-C 12/10/16 2126    Gareth Morgan, MD 12/11/16 1423

## 2016-12-10 NOTE — Discharge Instructions (Signed)
Alternate 600 mg of ibuprofen and [phone] mg of Tylenol every 3 hours as needed for pain. Do not exceed 4000 mg of Tylenol daily. Apply ice or heat to the affected area for comfort. Do some gentle stretching during hot showers or baths to avoid muscle stiffness. Follow up with primary care physician for reevaluation of your symptoms. Return to the ED immediately if any concerning signs or symptoms develop such as persisting or worsening shortness of breath or chest pain, coughing up blood, lightheadedness or passing out.

## 2016-12-10 NOTE — ED Triage Notes (Signed)
Patient c/o rigth sided chest pain that radiates to right arm that started on Monday night. Patient reports SOB with exertion and relieved when he sits down. Patient was seen at [place] [facility] yesterday and referred to hospital for further evaluation and work up but patient reports that he did not have a ride until today.

## 2016-12-10 NOTE — ED Notes (Signed)
ED Provider at bedside. 

## 2018-03-29 ENCOUNTER — Encounter (HOSPITAL_COMMUNITY): Payer: Self-pay

## 2018-03-29 ENCOUNTER — Ambulatory Visit (HOSPITAL_COMMUNITY)
Admission: EM | Admit: 2018-03-29 | Discharge: 2018-03-29 | Disposition: A | Discharge status: Home / Self Care | Payer: BLUE CROSS/BLUE SHIELD | Attending: Internal Medicine | Admitting: Internal Medicine

## 2018-03-29 DIAGNOSIS — B349 Viral infection, unspecified: Secondary | ICD-10-CM | POA: Insufficient documentation

## 2018-03-29 MED ORDER — DEXAMETHASONE SODIUM PHOSPHATE 10 MG/ML IJ SOLN
INTRAMUSCULAR | Status: AC
  Filled 2018-03-29: qty 1

## 2018-03-29 MED ORDER — DEXAMETHASONE SODIUM PHOSPHATE 10 MG/ML IJ SOLN
10.0000 mg | Freq: Once | INTRAMUSCULAR | Status: AC
  Administered 2018-03-29: 10 mg via INTRAMUSCULAR

## 2018-03-29 NOTE — ED Triage Notes (Signed)
Pt has had a fever off and on since Friday. Pt has tried [place] flu but its not working.

## 2018-03-29 NOTE — Discharge Instructions (Addendum)
Steroid injection given for throat irritation inflammation. You can take Tylenol or ibuprofen for body aches and fever Work note given Follow up as needed for continued or worsening symptoms

## 2018-03-31 NOTE — ED Provider Notes (Signed)
[facility]    CSN: [id number] Arrival date & time: 03/29/18  1813     History   Chief Complaint Chief Complaint  Patient presents with  . Fever    HPI Corey Brooks is a 53 y.o. male.   Patient is a 53 year old male that has had a waxing and waning fever and body aches since Friday.  He has had mild sore throat, ear pressure. .  He has been using TheraFlu for his symptoms.  Denies any recent sick contacts.  Denies any recent traveling.  No cough, chest congestion, chest pain, shortness of breath. His symptoms have somewhat improved.   ROS per HPI      Past Medical History:  Diagnosis Date  . Arthritis   . Asthma   . Depression   . Migraines     Patient Active Problem List   Diagnosis Date Noted  . 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 No pertinent surgical history.     Home Medications    Prior to Admission medications   Medication Sig Start Date End Date Taking? Authorizing Provider  budesonide-formoterol (SYMBICORT) 80-4.5 MCG/ACT inhaler Inhale 2 puffs into the lungs 2 (two) times daily.    [provider]  hydrochlorothiazide (HYDRODIURIL) 12.5 MG tablet Take 12.5 mg by mouth daily.    [provider]  sertraline (ZOLOFT) 100 MG tablet Take 100 mg by mouth daily.    [provider]  sertraline (ZOLOFT) 50 MG tablet Take 1 tablet (50 mg total) by mouth daily. Patient not taking: Reported on 12/10/2016 07/07/15   Delfin Gant, NP  SUMAtriptan (IMITREX) 100 MG tablet Take 100 mg by mouth every 2 (two) hours as needed for migraine. May repeat in 2 hours if headache persists or recurs.    [provider]    Family History History reviewed. No pertinent family history.  Social History Social History   Tobacco Use  . Smoking status: Never Smoker  . Smokeless tobacco: Never Used  Substance Use Topics  . Alcohol use: No  . Drug use: Not on file     Allergies     Asa [aspirin] and Caffeine   Review of Systems Review of Systems   Physical Exam Triage Vital Signs ED Triage Vitals  Enc Vitals Group     BP 03/29/18 1919 132/81     Pulse Rate 03/29/18 1919 66     Resp 03/29/18 1919 16     Temp 03/29/18 1919 98.3 F (36.8 C)     Temp Source 03/29/18 1919 Oral     SpO2 03/29/18 1919 99 %     Weight 03/29/18 1940 260 lb (117.9 kg)     Height --      Head Circumference --      Peak Flow --      Pain Score 03/29/18 1940 4     Pain Loc --      Pain Edu? --      Excl. in [place]? --    No data found.  Updated Vital Signs BP 132/81 (BP Location: Left Arm)   Pulse 66   Temp 98.3 F (36.8 C) (Oral)   Resp 16   Wt 260 lb (117.9 kg)   SpO2 99%   BMI 32.50 kg/m   Visual Acuity Right Eye Distance:   Left Eye Distance:   Bilateral Distance:    Right Eye Near:   Left Eye Near:    Bilateral Near:  Physical Exam Vitals signs and nursing note reviewed.  Constitutional:      General: He is not in acute distress.    Appearance: He is not ill-appearing, toxic-appearing or diaphoretic.  HENT:     Head: Normocephalic and atraumatic.     Right Ear: Tympanic membrane, ear canal and external ear normal.     Left Ear: Tympanic membrane, ear canal and external ear normal.     Nose: Nose normal.     Mouth/Throat:     Mouth: Mucous membranes are moist.     Pharynx: Oropharynx is clear. Posterior oropharyngeal erythema present.  Eyes:     Conjunctiva/sclera: Conjunctivae normal.  Neck:     Musculoskeletal: Normal range of motion.  Cardiovascular:     Rate and Rhythm: Normal rate and regular rhythm.     Pulses: Normal pulses.     Heart sounds: Normal heart sounds.  Pulmonary:     Effort: Pulmonary effort is normal.     Breath sounds: Normal breath sounds.  Musculoskeletal: Normal range of motion.  Lymphadenopathy:     Cervical: No cervical adenopathy.  Skin:    General: Skin is warm and dry.     Findings: No rash.  Neurological:      Mental Status: He is alert.  Psychiatric:        Mood and Affect: Mood normal.      UC Treatments / Results  Labs (all labs ordered are listed, but only abnormal results are displayed) Labs Reviewed - No data to display  EKG None  Radiology No results found.  Procedures Procedures (including critical care time)  Medications Ordered in UC Medications  dexamethasone (DECADRON) injection 10 mg (10 mg Intramuscular Given 03/29/18 2014)    Initial Impression / Assessment and Plan / UC Course  I have reviewed the triage vital signs and the nursing notes.  Pertinent labs & imaging results that were available during my care of the patient were reviewed by me and considered in my medical decision making (see chart for details).   Exam mostly normal  Mild erythema and swelling to the posterior oropharynx Still having some pain with swallowing Will give decadron injection here in the clinic for throat pain and inflammation.  Most likely viral related Symptomatic treatment at home.  Follow up as needed for continued or worsening symptoms   Final Clinical Impressions(s) / UC Diagnoses   Final diagnoses:  Viral illness     Discharge Instructions     Steroid injection given for throat irritation inflammation. You can take Tylenol or ibuprofen for body aches and fever Work note given Follow up as needed for continued or worsening symptoms     ED Prescriptions    None     Controlled Substance Prescriptions [place] Controlled Substance Registry consulted? Not Applicable   Orvan July, NP 03/31/18 1058

## 2019-03-17 ENCOUNTER — Emergency Department (HOSPITAL_COMMUNITY)
Admission: EM | Admit: 2019-03-17 | Discharge: 2019-03-17 | Disposition: A | Discharge status: Home / Self Care | Payer: BLUE CROSS/BLUE SHIELD | Attending: Emergency Medicine | Admitting: Emergency Medicine

## 2019-03-17 ENCOUNTER — Other Ambulatory Visit: Payer: Self-pay

## 2019-03-17 ENCOUNTER — Encounter (HOSPITAL_COMMUNITY): Payer: Self-pay

## 2019-03-17 ENCOUNTER — Emergency Department (HOSPITAL_COMMUNITY): Payer: BLUE CROSS/BLUE SHIELD

## 2019-03-17 DIAGNOSIS — R0789 Other chest pain: Secondary | ICD-10-CM | POA: Diagnosis present

## 2019-03-17 DIAGNOSIS — J45909 Unspecified asthma, uncomplicated: Secondary | ICD-10-CM | POA: Diagnosis not present

## 2019-03-17 DIAGNOSIS — Z79899 Other long term (current) drug therapy: Secondary | ICD-10-CM | POA: Insufficient documentation

## 2019-03-17 LAB — CBC
HCT: 43.4 % (ref 39.0–52.0)
Hemoglobin: 14.5 g/dL (ref 13.0–17.0)
MCH: 30.9 pg (ref 26.0–34.0)
MCHC: 33.4 g/dL (ref 30.0–36.0)
MCV: 92.3 fL (ref 80.0–100.0)
Platelets: 177 10*3/uL (ref 150–400)
RBC: 4.7 MIL/uL (ref 4.22–5.81)
RDW: 12.6 % (ref 11.5–15.5)
WBC: 7.5 10*3/uL (ref 4.0–10.5)
nRBC: 0 % (ref 0.0–0.2)

## 2019-03-17 LAB — BASIC METABOLIC PANEL
Anion gap: 6 (ref 5–15)
BUN: 8 mg/dL (ref 6–20)
CO2: 33 mmol/L — ABNORMAL HIGH (ref 22–32)
Calcium: 8.7 mg/dL — ABNORMAL LOW (ref 8.9–10.3)
Chloride: 101 mmol/L (ref 98–111)
Creatinine, Ser: 1.4 mg/dL — ABNORMAL HIGH (ref 0.61–1.24)
GFR calc Af Amer: >60 mL/min (ref >60)
GFR calc non Af Amer: 58 mL/min — ABNORMAL LOW (ref >60)
Glucose, Bld: 141 mg/dL — ABNORMAL HIGH (ref 70–99)
Potassium: 3.4 mmol/L — ABNORMAL LOW (ref 3.5–5.1)
Sodium: 140 mmol/L (ref 135–145)

## 2019-03-17 LAB — TROPONIN I (HIGH SENSITIVITY)
Troponin I (High Sensitivity): 3 ng/L (ref <18)
Troponin I (High Sensitivity): 3 ng/L (ref <18)

## 2019-03-17 MED ORDER — PANTOPRAZOLE SODIUM 40 MG PO TBEC
40.0000 mg | DELAYED_RELEASE_TABLET | Freq: Every day | ORAL | Status: DC
  Administered 2019-03-17: 13:00:00 40 mg via ORAL
  Filled 2019-03-17: qty 1

## 2019-03-17 MED ORDER — NAPROXEN 250 MG PO TABS
500.0000 mg | ORAL_TABLET | Freq: Once | ORAL | Status: AC
  Administered 2019-03-17: 500 mg via ORAL
  Filled 2019-03-17: qty 2

## 2019-03-17 MED ORDER — METHOCARBAMOL 500 MG PO TABS: 500.0000 mg | ORAL_TABLET | Freq: Two times a day (BID) | ORAL | 0 refills | Status: AC

## 2019-03-17 MED ORDER — SODIUM CHLORIDE 0.9% FLUSH: 3.0000 mL | Freq: Once | INTRAVENOUS | Status: DC

## 2019-03-17 NOTE — ED Provider Notes (Addendum)
[facility] Provider Note   CSN: [id number] Arrival date & time: 03/17/19  0142     History Chief Complaint  Patient presents with  . Chest Pain    Corey Brooks is a 53 y.o. male with PMH significant for migraines and HTN who presents to the ED with a 2-day history of atypical chest wall discomfort.  Patient reports that he first noticed left-sided chest pain first came on approximately 4 PM yesterday after lying down.  His chest discomfort radiated to his left arm, but it was all relieved with a menthol transdermal ointment provided by his mother.  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  He describes it as being immediately lateral and inferior to his left pectoralis muscle.  He reports that it is worse with any type of coughing or sneezing.  He describes it as a "pressure" sensation.  He states that he came to the ED for similar symptoms approximately 2 years ago and was relieved with what he believes to have been naproxen.  He denies any recent illness, fevers or chills, shortness of breath, pleuritic cough or pleuritic chest pain, abdominal pain, nausea or vomiting, blurred vision, back pain, diaphoresis, exertional dyspnea, weakness, numbness, tingling, or other focal neurologic deficits.  HPI     Past Medical History:  Diagnosis Date  . Arthritis   . Asthma   . Depression   . Migraines     Patient Active Problem List   Diagnosis Date Noted  . 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 No pertinent surgical history.     No family history on file.  Social History   Tobacco Use  . Smoking status: Never Smoker  . Smokeless tobacco: Never Used  Substance Use Topics  . Alcohol use: No  . Drug use: Not on file    Home Medications Prior to Admission medications   Medication  Sig Start Date End Date Taking? Authorizing Provider  budesonide-formoterol (SYMBICORT) 80-4.5 MCG/ACT inhaler Inhale 2 puffs into the lungs 2 (two) times daily.   Yes [provider]  hydrochlorothiazide (HYDRODIURIL) 12.5 MG tablet Take 12.5 mg by mouth daily.   Yes [provider]  sertraline (ZOLOFT) 50 MG tablet Take 1 tablet (50 mg total) by mouth daily. 07/07/15  Yes Delfin Gant, NP  SUMAtriptan (IMITREX) 100 MG tablet Take 100 mg by mouth every 2 (two) hours as needed for migraine. May repeat in 2 hours if headache persists or recurs.   Yes [provider]  traZODone (DESYREL) 100 MG tablet Take 100 mg by mouth at bedtime as needed for sleep. 02/23/19  Yes [provider]  methocarbamol (ROBAXIN) 500 MG tablet Take 1 tablet (500 mg total) by mouth 2 (two) times daily. 03/17/19   Corena Herter, PA-C    Allergies    Asa [aspirin] and Caffeine  Review of Systems   Review of Systems  Constitutional: Negative for activity change, diaphoresis and fever.  Respiratory: Negative for shortness of breath.   Cardiovascular: Positive for chest pain. Negative for palpitations.  Gastrointestinal: Negative for nausea.  Neurological: Negative for syncope and headaches.    Physical Exam Updated Vital Signs BP 125/82   Pulse 92   Temp 98.2 F (36.8 C) (Oral)   Resp 20   SpO2 95%   Physical Exam  Vitals and nursing note reviewed. Exam conducted with a chaperone present.  Constitutional:      Appearance: Normal appearance.  HENT:     Head: Normocephalic and atraumatic.  Eyes:     General: No scleral icterus.    Extraocular Movements: Extraocular movements intact.     Conjunctiva/sclera: Conjunctivae normal.     Pupils: Pupils are equal, round, and reactive to light.  Cardiovascular:     Rate and Rhythm: Normal rate and regular rhythm.     Pulses: Normal pulses.     Heart sounds: Normal heart sounds.  Pulmonary:     Effort: Pulmonary effort  is normal. No respiratory distress.     Breath sounds: Normal breath sounds.     Comments: Anterior chest wall tenderness.  Chest discomfort reproducible with physical exam.  Also reproducible with cough. Abdominal:     General: Abdomen is flat. There is no distension.     Palpations: Abdomen is soft.     Tenderness: There is no abdominal tenderness. There is no guarding.  Musculoskeletal:     Cervical back: Normal range of motion.  Skin:    General: Skin is dry.  Neurological:     General: No focal deficit present.     Mental Status: He is alert and oriented to person, place, and time.     GCS: GCS eye subscore is 4. GCS verbal subscore is 5. GCS motor subscore is 6.  Psychiatric:        Mood and Affect: Mood normal.        Behavior: Behavior normal.        Thought Content: Thought content normal.     ED Results / Procedures / Treatments   Labs (all labs ordered are listed, but only abnormal results are displayed) Labs Reviewed  BASIC METABOLIC PANEL - Abnormal; Notable for the following components:      Result Value   Potassium 3.4 (*)    CO2 33 (*)    Glucose, Bld 141 (*)    Creatinine, Ser 1.40 (*)    Calcium 8.7 (*)    GFR calc non Af Amer 58 (*)    All other components within normal limits  CBC  TROPONIN I (HIGH SENSITIVITY)  TROPONIN I (HIGH SENSITIVITY)    EKG EKG Interpretation  Date/Time:  Thursday March 17 2019 01:48:45 EST Ventricular Rate:  103 PR Interval:  176 QRS Duration: 78 QT Interval:  324 QTC Calculation: 424 R Axis:   6 Text Interpretation: Sinus tachycardia ST & T wave abnormality, consider lateral ischemia Abnormal ECG Since last tracing rate faster Confirmed by Wandra Arthurs [phone]) on 03/17/2019 11:34:03 AM   Radiology DG Chest 2 View  Result Date: 03/17/2019 CLINICAL DATA:  Chest pain radiating to back and left arm since yesterday afternoon EXAM: CHEST - 2 VIEW COMPARISON:  Radiograph 12/10/2016, CTA chest 08/15/2009 FINDINGS: Low  lung volumes with central vascular crowding. No consolidation, features of edema, pneumothorax, or effusion. The cardiomediastinal contours are unremarkable. No acute osseous or soft tissue abnormality. IMPRESSION: Low lung volumes with central vascular crowding. Otherwise no acute findings in the chest. Electronically Signed   By: Lovena Le M.D.   On: 03/17/2019 02:49    Procedures Procedures (including critical care time)  Medications Ordered in ED Medications  sodium chloride flush (NS) 0.9 % injection 3 mL (0 mLs Intravenous Hold 03/17/19 1324)  pantoprazole (PROTONIX) EC tablet 40 mg (40 mg Oral Given 03/17/19 1323)  naproxen (NAPROSYN) tablet 500  mg (500 mg Oral Given 03/17/19 1323)    ED Course  I have reviewed the triage vital signs and the nursing notes.  Pertinent labs & imaging results that were available during my care of the patient were reviewed by me and considered in my medical decision making (see chart for details).    MDM Rules/Calculators/A&P                      DG chest is interpreted and demonstrates vascular crowding, no evidence of consolidation, pneumothorax, or other acute cardiopulmonary findings.  EKG was reviewed and similar to prior tracings, albeit mildly tachycardic.  On my exam in the room, patient's vital signs all within normal limits.  He is hemodynamically stable and neurovascularly intact.  No acute distress.  His chest wall discomfort has slowly improved since onset last night.  Troponin is well within normal limits and does not require trending as his onset of discomfort was well before labs were obtained.  CBC demonstrates no anemia or leukocytosis concerning for infection.  BMP is also reviewed and refused mildly bumped creatinine, but likely attributable to a prerenal azotemia.  Encourage increase oral hydration.  Patient chest wall discomfort is reproducible on physical exam.  He reports that he has taken naproxen in the past, with good effect.   He also reports that he believes he had a peptic ulcer at one point, but denies tobacco use, alcohol use, or heartburn.  Given his discomfort and report that naproxen has worked well for him in the past, will provide with naproxen here in the ED as well as Protonix and reassess.  Patient is feeling slightly improved.  He also suspects that it is likely musculoskeletal etiology, possibly muscle spasms.  Will prescribe short course of Robaxin to help with his spasms encourage him to continue taking Tylenol 500 mg as needed for his discomfort.  Given current use of Zoloft in addition to his history of PUD, prefer to avoid continued naproxen if at all possible.  Strict return precautions discussed. All of the evaluation and work-up results were discussed with the patient and any family at bedside. They were provided opportunity to ask any additional questions and have none at this time. They have expressed understanding of verbal discharge instructions as well as return precautions and are agreeable to the plan.    Final Clinical Impression(s) / ED Diagnoses Final diagnoses:  Chest wall pain    Rx / DC Orders ED Discharge Orders         Ordered    methocarbamol (ROBAXIN) 500 MG tablet  2 times daily     03/17/19 1440           Reita Chard 03/17/19 1440    Corena Herter, PA-C 03/17/19 1441    Drenda Freeze, MD 03/17/19 [phone]

## 2019-03-17 NOTE — Discharge Instructions (Signed)
Your history, physical exam, and work-up is consistent with anterior chest wall discomfort.  Likely musculoskeletal etiology.  Reproducible on physical exam.  You were given a prescription for Robaxin which is a muscle relaxer.  You should not drive, work, consume alcohol, or operate machinery while taking this medication as it can make you very drowsy.    Continue taking Tylenol 500 mg every 6 hours as needed for your pain and discomfort.  Also continue using your topical, transdermal pain relieving ointment.   Please return to the ED or seek medical attention for any new or worsening symptoms.

## 2019-03-17 NOTE — ED Notes (Signed)
Patient verbalizes understanding of discharge instructions. Opportunity for questioning and answers were provided. Armband removed by staff, pt discharged from ED to home via POV  

## 2019-03-17 NOTE — ED Triage Notes (Signed)
Pt states that he has been having L sided CP that radiates to back and L arm since yesterday afternoon, pain is sharp and getting worse, denies n/v

## 2019-09-23 ENCOUNTER — Ambulatory Visit: Payer: Self-pay

## 2021-04-18 ENCOUNTER — Other Ambulatory Visit: Payer: Self-pay | Admitting: Internal Medicine

## 2021-04-18 DIAGNOSIS — E559 Vitamin D deficiency, unspecified: Secondary | ICD-10-CM | POA: Diagnosis not present

## 2021-04-18 DIAGNOSIS — Z0000 Encounter for general adult medical examination without abnormal findings: Secondary | ICD-10-CM | POA: Diagnosis not present

## 2021-04-18 DIAGNOSIS — Z13220 Encounter for screening for lipoid disorders: Secondary | ICD-10-CM | POA: Diagnosis not present

## 2021-04-18 DIAGNOSIS — Z125 Encounter for screening for malignant neoplasm of prostate: Secondary | ICD-10-CM | POA: Diagnosis not present

## 2021-04-19 LAB — LIPID PANEL
Cholesterol: 146 mg/dL (ref <200)
HDL: 33 mg/dL — ABNORMAL LOW (ref >=40)
LDL Cholesterol (Calc): 92 mg/dL (calc)
Non-HDL Cholesterol (Calc): 113 mg/dL (calc) (ref <130)
Total CHOL/HDL Ratio: 4.4 (calc) (ref <5.0)
Triglycerides: 110 mg/dL (ref <150)

## 2021-04-19 LAB — COMPLETE METABOLIC PANEL WITH GFR
AG Ratio: 1.6 (calc) (ref 1.0–2.5)
ALT: 13 U/L (ref 9–46)
AST: 16 U/L (ref 10–35)
Albumin: 3.7 g/dL (ref 3.6–5.1)
Alkaline phosphatase (APISO): 35 U/L (ref 35–144)
BUN: 9 mg/dL (ref 7–25)
CO2: 30 mmol/L (ref 20–32)
Calcium: 8.5 mg/dL — ABNORMAL LOW (ref 8.6–10.3)
Chloride: 102 mmol/L (ref 98–110)
Creat: 1.2 mg/dL (ref 0.70–1.30)
Globulin: 2.3 g/dL (calc) (ref 1.9–3.7)
Glucose, Bld: 111 mg/dL — ABNORMAL HIGH (ref 65–99)
Potassium: 4.1 mmol/L (ref 3.5–5.3)
Sodium: 139 mmol/L (ref 135–146)
Total Bilirubin: 0.6 mg/dL (ref 0.2–1.2)
Total Protein: 6 g/dL — ABNORMAL LOW (ref 6.1–8.1)
eGFR: 72 mL/min/{1.73_m2} (ref >=60)

## 2021-04-19 LAB — CBC
HCT: 44 % (ref 38.5–50.0)
Hemoglobin: 14.6 g/dL (ref 13.2–17.1)
MCH: 30.5 pg (ref 27.0–33.0)
MCHC: 33.2 g/dL (ref 32.0–36.0)
MCV: 92.1 fL (ref 80.0–100.0)
MPV: 11.5 fL (ref 7.5–12.5)
Platelets: 202 10*3/uL (ref 140–400)
RBC: 4.78 10*6/uL (ref 4.20–5.80)
RDW: 12.2 % (ref 11.0–15.0)
WBC: 4.6 10*3/uL (ref 3.8–10.8)

## 2021-04-19 LAB — VITAMIN D 25 HYDROXY (VIT D DEFICIENCY, FRACTURES): Vit D, 25-Hydroxy: 38 ng/mL (ref 30–100)

## 2021-04-19 LAB — PSA: PSA: 1.49 ng/mL (ref <=4.00)

## 2021-04-19 LAB — TSH: TSH: 1.07 mIU/L (ref 0.40–4.50)

## 2021-05-02 DIAGNOSIS — R69 Illness, unspecified: Secondary | ICD-10-CM | POA: Diagnosis not present

## 2021-05-17 DIAGNOSIS — R69 Illness, unspecified: Secondary | ICD-10-CM | POA: Diagnosis not present

## 2021-05-30 ENCOUNTER — Encounter: Payer: Self-pay | Admitting: Gastroenterology

## 2021-06-18 DIAGNOSIS — F4011 Social phobia, generalized: Secondary | ICD-10-CM | POA: Diagnosis not present

## 2021-06-18 DIAGNOSIS — R69 Illness, unspecified: Secondary | ICD-10-CM | POA: Diagnosis not present

## 2021-06-25 ENCOUNTER — Ambulatory Visit (AMBULATORY_SURGERY_CENTER): Payer: Self-pay | Admitting: *Deleted

## 2021-06-25 VITALS — Ht 75.0 in | Wt 285.0 lb

## 2021-06-25 DIAGNOSIS — Z1211 Encounter for screening for malignant neoplasm of colon: Secondary | ICD-10-CM

## 2021-06-25 MED ORDER — NA SULFATE-K SULFATE-MG SULF 17.5-3.13-1.6 GM/177ML PO SOLN: 1.0000 | ORAL | 0 refills | Status: DC

## 2021-06-25 NOTE — Progress Notes (Signed)
Patient's pre-visit was done today over the phone with the patient. Name,DOB and address verified. Patient denies any allergies to Eggs and Soy. Patient denies any problems with anesthesia/sedation. Patient is not taking any diet pills or blood thinners. No home Oxygen. Insurance confirmed with patient.  ? ?Prep instructions mailed to pt-pt is aware. Patient understands to call us back with any questions or concerns. Patient is aware of our care-partner policy. Allowed pt to have "light breakfast" morning of procedure only to help prevent him from having migraine. Pt is worries this may happen. Encouraged pt to stay well hydrated.  ?   ?

## 2021-07-04 ENCOUNTER — Other Ambulatory Visit (HOSPITAL_COMMUNITY): Payer: Self-pay | Admitting: Neurology

## 2021-07-04 DIAGNOSIS — G43709 Chronic migraine without aura, not intractable, without status migrainosus: Secondary | ICD-10-CM

## 2021-07-04 DIAGNOSIS — R69 Illness, unspecified: Secondary | ICD-10-CM | POA: Diagnosis not present

## 2021-07-16 ENCOUNTER — Encounter: Payer: Self-pay | Admitting: Gastroenterology

## 2021-07-16 ENCOUNTER — Ambulatory Visit (AMBULATORY_SURGERY_CENTER): Payer: 59 | Admitting: Gastroenterology

## 2021-07-16 VITALS — BP 124/84 | HR 73 | Temp 97.8°F | Resp 13 | Ht 75.0 in | Wt 285.0 lb

## 2021-07-16 DIAGNOSIS — D124 Benign neoplasm of descending colon: Secondary | ICD-10-CM | POA: Diagnosis not present

## 2021-07-16 DIAGNOSIS — D122 Benign neoplasm of ascending colon: Secondary | ICD-10-CM | POA: Diagnosis not present

## 2021-07-16 DIAGNOSIS — D120 Benign neoplasm of cecum: Secondary | ICD-10-CM | POA: Diagnosis not present

## 2021-07-16 DIAGNOSIS — Z1211 Encounter for screening for malignant neoplasm of colon: Secondary | ICD-10-CM

## 2021-07-16 MED ORDER — SODIUM CHLORIDE 0.9 % IV SOLN: 500.0000 mL | INTRAVENOUS | Status: DC

## 2021-07-16 NOTE — Progress Notes (Signed)
? ?GASTROENTEROLOGY PROCEDURE H&P NOTE  ? ?Primary Care Physician: ?Nolene Ebbs, MD ? ?HPI: ?Corey Brooks is a 56 y.o. male who presents for colonoscopy for screening. ? ?Past Medical History:  ?Diagnosis Date  ? Arthritis   ? Asthma   ? Depression   ? Hypertension   ? Migraines   ? Sleep apnea   ? no cpap  ? ?Past Surgical History:  ?Procedure Laterality Date  ? UPPER GASTROINTESTINAL ENDOSCOPY  2002  ? stomach ulcer  ? ?Current Outpatient Medications  ?Medication Sig Dispense Refill  ? albuterol (PROVENTIL) (2.5 MG/3ML) 0.083% nebulizer solution Take 2.5 mg by nebulization every 6 (six) hours as needed.    ? albuterol (VENTOLIN HFA) 108 (90 Base) MCG/ACT inhaler SMARTSIG:2 Puff(s) Via Inhaler 4 Times Daily PRN    ? budesonide-formoterol (SYMBICORT) 160-4.5 MCG/ACT inhaler Inhale 2 puffs into the lungs 2 (two) times daily.    ? budesonide-formoterol (SYMBICORT) 80-4.5 MCG/ACT inhaler Inhale 2 puffs into the lungs 2 (two) times daily.    ? FLUoxetine (PROZAC) 10 MG tablet Take 10 mg by mouth daily.    ? hydrochlorothiazide (HYDRODIURIL) 12.5 MG tablet Take 12.5 mg by mouth daily.    ? loratadine (CLARITIN) 10 MG tablet Take 10 mg by mouth daily as needed.    ? methocarbamol (ROBAXIN) 500 MG tablet Take 1 tablet (500 mg total) by mouth 2 (two) times daily. 14 tablet 0  ? montelukast (SINGULAIR) 10 MG tablet Take 10 mg by mouth at bedtime.    ? Na Sulfate-K Sulfate-Mg Sulf 17.5-3.13-1.6 GM/177ML SOLN Take 1 kit by mouth as directed. May use generic Suprep 354 mL 0  ? SUMAtriptan (IMITREX) 100 MG tablet Take 100 mg by mouth every 2 (two) hours as needed for migraine. May repeat in 2 hours if headache persists or recurs.    ? traZODone (DESYREL) 100 MG tablet Take 100 mg by mouth at bedtime as needed for sleep.    ? Vitamin D, Ergocalciferol, (DRISDOL) 1.25 MG (50000 UNIT) CAPS capsule Take 50,000 Units by mouth once a week.    ? ?No current facility-administered medications for this visit.  ? ? ?Current  Outpatient Medications:  ?  albuterol (PROVENTIL) (2.5 MG/3ML) 0.083% nebulizer solution, Take 2.5 mg by nebulization every 6 (six) hours as needed., Disp: , Rfl:  ?  albuterol (VENTOLIN HFA) 108 (90 Base) MCG/ACT inhaler, SMARTSIG:2 Puff(s) Via Inhaler 4 Times Daily PRN, Disp: , Rfl:  ?  budesonide-formoterol (SYMBICORT) 160-4.5 MCG/ACT inhaler, Inhale 2 puffs into the lungs 2 (two) times daily., Disp: , Rfl:  ?  budesonide-formoterol (SYMBICORT) 80-4.5 MCG/ACT inhaler, Inhale 2 puffs into the lungs 2 (two) times daily., Disp: , Rfl:  ?  FLUoxetine (PROZAC) 10 MG tablet, Take 10 mg by mouth daily., Disp: , Rfl:  ?  hydrochlorothiazide (HYDRODIURIL) 12.5 MG tablet, Take 12.5 mg by mouth daily., Disp: , Rfl:  ?  loratadine (CLARITIN) 10 MG tablet, Take 10 mg by mouth daily as needed., Disp: , Rfl:  ?  methocarbamol (ROBAXIN) 500 MG tablet, Take 1 tablet (500 mg total) by mouth 2 (two) times daily., Disp: 14 tablet, Rfl: 0 ?  montelukast (SINGULAIR) 10 MG tablet, Take 10 mg by mouth at bedtime., Disp: , Rfl:  ?  Na Sulfate-K Sulfate-Mg Sulf 17.5-3.13-1.6 GM/177ML SOLN, Take 1 kit by mouth as directed. May use generic Suprep, Disp: 354 mL, Rfl: 0 ?  SUMAtriptan (IMITREX) 100 MG tablet, Take 100 mg by mouth every 2 (two) hours as needed for  migraine. May repeat in 2 hours if headache persists or recurs., Disp: , Rfl:  ?  traZODone (DESYREL) 100 MG tablet, Take 100 mg by mouth at bedtime as needed for sleep., Disp: , Rfl:  ?  Vitamin D, Ergocalciferol, (DRISDOL) 1.25 MG (50000 UNIT) CAPS capsule, Take 50,000 Units by mouth once a week., Disp: , Rfl:  ?Allergies  ?Allergen Reactions  ? Asa [Aspirin] Other (See Comments)  ?  Stomach ulcer  ? Caffeine   ?  Migrains  ? ?Family History  ?Problem Relation Age of Onset  ? Colon cancer Neg Hx   ? Colon polyps Neg Hx   ? ?Social History  ? ?Socioeconomic History  ? Marital status: Single  ?  Spouse name: Not on file  ? Number of children: Not on file  ? Years of education: Not on  file  ? Highest education level: Not on file  ?Occupational History  ? Not on file  ?Tobacco Use  ? Smoking status: Never  ? Smokeless tobacco: Never  ?Vaping Use  ? Vaping Use: Never used  ?Substance and Sexual Activity  ? Alcohol use: No  ? Drug use: Not Currently  ? Sexual activity: Not on file  ?Other Topics Concern  ? Not on file  ?Social History Narrative  ? Not on file  ? ?Social Determinants of Health  ? ?Financial Resource Strain: Not on file  ?Food Insecurity: Not on file  ?Transportation Needs: Not on file  ?Physical Activity: Not on file  ?Stress: Not on file  ?Social Connections: Not on file  ?Intimate Partner Violence: Not on file  ? ? ?Physical Exam: ?There were no vitals filed for this visit. ?There is no height or weight on file to calculate BMI. ?GEN: NAD ?EYE: Sclerae anicteric ?ENT: MMM ?CV: Non-tachycardic ?GI: Soft, NT/ND ?NEURO:  Alert & Oriented x 3 ? ?Lab Results: ?No results for input(s): WBC, HGB, HCT, PLT in the last 72 hours. ?BMET ?No results for input(s): NA, K, CL, CO2, GLUCOSE, BUN, CREATININE, CALCIUM in the last 72 hours. ?LFT ?No results for input(s): PROT, ALBUMIN, AST, ALT, ALKPHOS, BILITOT, BILIDIR, IBILI in the last 72 hours. ?PT/INR ?No results for input(s): LABPROT, INR in the last 72 hours. ? ? ?Impression / Plan: ?This is a 56 y.o.male who presents for colonoscopy for screening. ? ?The risks and benefits of endoscopic evaluation/treatment were discussed with the patient and/or family; these include but are not limited to the risk of perforation, infection, bleeding, missed lesions, lack of diagnosis, severe illness requiring hospitalization, as well as anesthesia and sedation related illnesses.  The patient's history has been reviewed, patient examined, no change in status, and deemed stable for procedure.  The patient and/or family is agreeable to proceed.  ? ? ?Justice Britain, MD ?[facility] ?Advanced Endoscopy ?Office # [phone] ? ?

## 2021-07-16 NOTE — Progress Notes (Signed)
Called to room to assist during endoscopic procedure.  Patient ID and intended procedure confirmed with present staff. Received instructions for my participation in the procedure from the performing physician.  

## 2021-07-16 NOTE — Op Note (Signed)
[place] ?Patient Name: Corey Brooks ?Procedure Date: 07/16/2021 2:27 PM ?MRN: [id number] ?Endoscopist: Justice Britain , MD ?Age: 56 ?Referring MD:  ?Date of Birth: 1965-06-22 ?Gender: Male ?Account #: [id number] ?Procedure:                Colonoscopy ?Indications:              Screening for colorectal malignant neoplasm, This  ?                          is the patient's first colonoscopy ?Medicines:                Monitored Anesthesia Care ?Procedure:                Pre-Anesthesia Assessment: ?                          - Prior to the procedure, a History and Physical  ?                          was performed, and patient medications and  ?                          allergies were reviewed. The patient's tolerance of  ?                          previous anesthesia was also reviewed. The risks  ?                          and benefits of the procedure and the sedation  ?                          options and risks were discussed with the patient.  ?                          All questions were answered, and informed consent  ?                          was obtained. Prior Anticoagulants: The patient has  ?                          taken no previous anticoagulant or antiplatelet  ?                          agents except for NSAID medication. ASA Grade  ?                          Assessment: III - A patient with severe systemic  ?                          disease. After reviewing the risks and benefits,  ?                          the patient was deemed in satisfactory condition to  ?  undergo the procedure. ?                          After obtaining informed consent, the colonoscope  ?                          was passed under direct vision. Throughout the  ?                          procedure, the patient's blood pressure, pulse, and  ?                          oxygen saturations were monitored continuously. The  ?                          CF [id number] #[id number] was introduced  through the anus  ?                          and advanced to the 5 cm into the ileum. The  ?                          colonoscopy was performed without difficulty. The  ?                          patient tolerated the procedure. The quality of the  ?                          bowel preparation was adequate. The terminal ileum,  ?                          ileocecal valve, appendiceal orifice, and rectum  ?                          were photographed. ?Scope In: 2:47:35 PM ?Scope Out: 3:03:51 PM ?Scope Withdrawal Time: 0 hours 13 minutes 14 seconds  ?Total Procedure Duration: 0 hours 16 minutes 16 seconds  ?Findings:                 The digital rectal exam findings include  ?                          hemorrhoids. Pertinent negatives include no  ?                          palpable rectal lesions. ?                          The terminal ileum and ileocecal valve appeared  ?                          normal. ?                          Three sessile polyps were found in the descending  ?  colon, ascending colon and cecum. The polyps were 2  ?                          to 5 mm in size. These polyps were removed with a  ?                          cold snare. Resection and retrieval were complete. ?                          Normal mucosa was found in the entire colon  ?                          otherwise. ?                          Non-bleeding non-thrombosed external and internal  ?                          hemorrhoids were found during retroflexion, during  ?                          perianal exam and during digital exam. The  ?                          hemorrhoids were Grade II (internal hemorrhoids  ?                          that prolapse but reduce spontaneously). ?Complications:            No immediate complications. ?Estimated Blood Loss:     Estimated blood loss was minimal. ?Impression:               - Hemorrhoids found on digital rectal exam. ?                          - The examined portion of  the ileum was normal. ?                          - Three 2 to 5 mm polyps in the descending colon,  ?                          in the ascending colon and in the cecum, removed  ?                          with a cold snare. Resected and retrieved. ?                          - Normal mucosa in the entire examined colon  ?                          otherwise. ?                          - Non-bleeding non-thrombosed external and internal  ?  hemorrhoids. ?Recommendation:           - The patient will be observed post-procedure,  ?                          until all discharge criteria are met. ?                          - Discharge patient to home. ?                          - Patient has a contact number available for  ?                          emergencies. The signs and symptoms of potential  ?                          delayed complications were discussed with the  ?                          patient. Return to normal activities tomorrow.  ?                          Written discharge instructions were provided to the  ?                          patient. ?                          - High fiber diet. ?                          - Continue present medications. ?                          - Await pathology results. ?                          - Repeat colonoscopy in 3/5/7 years for  ?                          surveillance based on pathology results. ?                          - The findings and recommendations were discussed  ?                          with the patient. ?Justice Britain, MD ?07/16/2021 3:13:03 PM ?

## 2021-07-16 NOTE — Progress Notes (Signed)
No problems noted in the recovery room. maw 

## 2021-07-16 NOTE — Progress Notes (Signed)
Pt non-responsive, VVS, Report to RN  °

## 2021-07-16 NOTE — Patient Instructions (Addendum)
Handouts were given to your care partner on polyps, hemorrhoids and a high fiber diet with liberal fluid intake. ?You may resume your current medications today. ?Await biopsy results.  May take 1-3 weeks to receive pathology results. ?Please call if any questions or concerns. ?  ? ? ? ?YOU HAD AN ENDOSCOPIC PROCEDURE TODAY AT [facility]:   Refer to the procedure report that was given to you for any specific questions about what was found during the examination.  If the procedure report does not answer your questions, please call your gastroenterologist to clarify.  If you requested that your care partner not be given the details of your procedure findings, then the procedure report has been included in a sealed envelope for you to review at your convenience later. ? ?YOU SHOULD EXPECT: Some feelings of bloating in the abdomen. Passage of more gas than usual.  Walking can help get rid of the air that was put into your GI tract during the procedure and reduce the bloating. If you had a lower endoscopy (such as a colonoscopy or flexible sigmoidoscopy) you may notice spotting of blood in your stool or on the toilet paper. If you underwent a bowel prep for your procedure, you may not have a normal bowel movement for a few days. ? ?Please Note:  You might notice some irritation and congestion in your nose or some drainage.  This is from the oxygen used during your procedure.  There is no need for concern and it should clear up in a day or so. ? ?SYMPTOMS TO REPORT IMMEDIATELY: ? ?Following lower endoscopy (colonoscopy or flexible sigmoidoscopy): ? Excessive amounts of blood in the stool ? Significant tenderness or worsening of abdominal pains ? Swelling of the abdomen that is new, acute ? Fever of 100?F or higher ? ? ?For urgent or emergent issues, a gastroenterologist can be reached at any hour by calling [phone]. ?Do not use MyChart messaging for urgent concerns.  ? ? ?DIET:  We do recommend a  small meal at first, but then you may proceed to your regular diet.  Drink plenty of fluids but you should avoid alcoholic beverages for 24 hours. ? ?ACTIVITY:  You should plan to take it easy for the rest of today and you should NOT DRIVE or use heavy machinery until tomorrow (because of the sedation medicines used during the test).   ? ?FOLLOW UP: ?Our staff will call the number listed on your records 48-72 hours following your procedure to check on you and address any questions or concerns that you may have regarding the information given to you following your procedure. If we do not reach you, we will leave a message.  We will attempt to reach you two times.  During this call, we will ask if you have developed any symptoms of COVID 19. If you develop any symptoms (ie: fever, flu-like symptoms, shortness of breath, cough etc.) before then, please call [phone].  If you test positive for Covid 19 in the 2 weeks post procedure, please call and report this information to [place].   ? ?If any biopsies were taken you will be contacted by phone or by letter within the next 1-3 weeks.  Please call us at [phone] if you have not heard about the biopsies in 3 weeks.  ? ? ?SIGNATURES/CONFIDENTIALITY: ?You and/or your care partner have signed paperwork which will be entered into your electronic medical record.  These signatures attest to the fact that  that the information above on your After Visit Summary has been reviewed and is understood.  Full responsibility of the confidentiality of this discharge information lies with you and/or your care-partner.  ?

## 2021-07-18 ENCOUNTER — Telehealth: Payer: Self-pay

## 2021-07-18 NOTE — Telephone Encounter (Signed)
Attempted to reach patient for post-procedure f/u call. No answer. Left message. ?

## 2021-07-18 NOTE — Telephone Encounter (Signed)
?  Follow up Call- ? ? ?  07/16/2021  ?  1:40 PM  ?Call back number  ?Post procedure Call Back phone  # [phone]  ?Permission to leave phone message Yes  ?  ? ?Patient questions: ? ?Do you have a fever, pain , or abdominal swelling? No. ?Pain Score  0 * ? ?Have you tolerated food without any problems? Yes.   ? ?Have you been able to return to your normal activities? Yes.   ? ?Do you have any questions about your discharge instructions: ?Diet   No. ?Medications  No. ?Follow up visit  No. ? ?Do you have questions or concerns about your Care? No. ? ?Actions: ?* If pain score is 4 or above: ?No action needed, pain <4. ? ? ?

## 2021-07-22 ENCOUNTER — Encounter: Payer: Self-pay | Admitting: Gastroenterology

## 2021-07-23 ENCOUNTER — Ambulatory Visit (HOSPITAL_COMMUNITY)
Admission: RE | Admit: 2021-07-23 | Discharge: 2021-07-23 | Disposition: A | Discharge status: Home / Self Care | Payer: 59 | Source: Ambulatory Visit | Attending: Neurology | Admitting: Neurology

## 2021-07-23 DIAGNOSIS — G43709 Chronic migraine without aura, not intractable, without status migrainosus: Secondary | ICD-10-CM | POA: Diagnosis not present

## 2021-07-23 DIAGNOSIS — G43909 Migraine, unspecified, not intractable, without status migrainosus: Secondary | ICD-10-CM | POA: Diagnosis not present

## 2021-07-26 ENCOUNTER — Telehealth: Payer: Self-pay | Admitting: Gastroenterology

## 2021-07-26 NOTE — Telephone Encounter (Signed)
Returned call to patient. I informed him that we have composed the letter and he can pick it up from the 2nd floor receptionist desk at his convenience. Pt is aware that the receptionist leave by 4:30 pm daily. Pt also reported a bitter taste in his mouth since his procedure, I told pt that he had a colonoscopy and that should not have that type of effect. He also reported a rectal sensation that is hard to describe. I told pt that he can try RectiCare OTC if he would like. Pt verbalized understanding and had no concerns at the end of the call. ?

## 2021-07-26 NOTE — Telephone Encounter (Signed)
Inbound call from patient stating that he needed a doctors note from being out of his class on the day of his procedure 4/25. Please advise.  ?

## 2021-08-08 DIAGNOSIS — R69 Illness, unspecified: Secondary | ICD-10-CM | POA: Diagnosis not present

## 2021-08-21 DIAGNOSIS — R69 Illness, unspecified: Secondary | ICD-10-CM | POA: Diagnosis not present

## 2022-09-26 ENCOUNTER — Ambulatory Visit
Admission: EM | Admit: 2022-09-26 | Discharge: 2022-09-26 | Disposition: A | Discharge status: Home / Self Care | Payer: No Typology Code available for payment source | Attending: Urgent Care | Admitting: Urgent Care

## 2022-09-26 DIAGNOSIS — S61212A Laceration without foreign body of right middle finger without damage to nail, initial encounter: Secondary | ICD-10-CM

## 2022-09-26 DIAGNOSIS — Z23 Encounter for immunization: Secondary | ICD-10-CM

## 2022-09-26 MED ORDER — TETANUS-DIPHTH-ACELL PERTUSSIS 5-2.5-18.5 LF-MCG/0.5 IM SUSY
0.5000 mL | PREFILLED_SYRINGE | Freq: Once | INTRAMUSCULAR | Status: AC
Start: 2022-09-26 — End: 2022-09-26
  Administered 2022-09-26: 0.5 mL via INTRAMUSCULAR

## 2022-09-26 NOTE — ED Provider Notes (Signed)
[facility]  Note:  This document was prepared using Conservation officer, historic buildings and may include unintentional dictation errors.  MRN: [id number] DOB: [birth year]  Subjective:   Corey Brooks is a 57 y.o. male presenting for suffering a right middle finger laceration at work. Injury occurred 1.5 hours ago, was from a metal tie. Tdap status unknown. Wast tended to by EMS on site at work. Advised to come to our clinic for an evaluation thereafter.   No current facility-administered medications for this encounter.  Current Outpatient Medications:    albuterol (PROVENTIL) (2.5 MG/3ML) 0.083% nebulizer solution, Take 2.5 mg by nebulization every 6 (six) hours as needed., Disp: , Rfl:    albuterol (VENTOLIN HFA) 108 (90 Base) MCG/ACT inhaler, SMARTSIG:2 Puff(s) Via Inhaler 4 Times Daily PRN, Disp: , Rfl:    budesonide-formoterol (SYMBICORT) 160-4.5 MCG/ACT inhaler, Inhale 2 puffs into the lungs 2 (two) times daily., Disp: , Rfl:    budesonide-formoterol (SYMBICORT) 80-4.5 MCG/ACT inhaler, Inhale 2 puffs into the lungs 2 (two) times daily. (Patient not taking: Reported on 07/16/2021), Disp: , Rfl:    FLUoxetine (PROZAC) 10 MG tablet, Take 10 mg by mouth daily., Disp: , Rfl:    hydrochlorothiazide (HYDRODIURIL) 12.5 MG tablet, Take 12.5 mg by mouth daily., Disp: , Rfl:    loratadine (CLARITIN) 10 MG tablet, Take 10 mg by mouth daily as needed., Disp: , Rfl:    methocarbamol (ROBAXIN) 500 MG tablet, Take 1 tablet (500 mg total) by mouth 2 (two) times daily., Disp: 14 tablet, Rfl: 0   montelukast (SINGULAIR) 10 MG tablet, Take 10 mg by mouth at bedtime., Disp: , Rfl:    SUMAtriptan (IMITREX) 100 MG tablet, Take 100 mg by mouth every 2 (two) hours as needed for migraine. May repeat in 2 hours if headache persists or recurs., Disp: , Rfl:    traZODone (DESYREL) 100 MG tablet, Take 100 mg by mouth at bedtime as needed for sleep., Disp: , Rfl:    Vitamin D, Ergocalciferol,  (DRISDOL) 1.25 MG (50000 UNIT) CAPS capsule, Take 50,000 Units by mouth once a week., Disp: , Rfl:    Allergies  Allergen Reactions   Asa [Aspirin] Other (See Comments)    Stomach ulcer   Caffeine     Migrains    Past Medical History:  Diagnosis Date   Arthritis    Asthma    Depression    Hypertension    Migraines    Sleep apnea    no cpap     Past Surgical History:  Procedure Laterality Date   UPPER GASTROINTESTINAL ENDOSCOPY  2002   stomach ulcer    Family History  Problem Relation Age of Onset   Colon cancer Neg Hx    Colon polyps Neg Hx     Social History   Tobacco Use   Smoking status: Never   Smokeless tobacco: Never  Vaping Use   Vaping Use: Never used  Substance Use Topics   Alcohol use: No   Drug use: Not Currently    ROS   Objective:   Vitals: BP (!) 174/104 (BP Location: Right Arm)   Pulse (!) 118   Temp 99.8 F (37.7 C) (Oral)   Resp 20   SpO2 94%   Physical Exam Constitutional:      General: He is not in acute distress.    Appearance: Normal appearance. He is well-developed and normal weight. He is not ill-appearing, toxic-appearing or diaphoretic.  HENT:  Head: Normocephalic and atraumatic.     Right Ear: External ear normal.     Left Ear: External ear normal.     Nose: Nose normal.     Mouth/Throat:     Pharynx: Oropharynx is clear.  Eyes:     General: No scleral icterus.       Right eye: No discharge.        Left eye: No discharge.     Extraocular Movements: Extraocular movements intact.  Cardiovascular:     Rate and Rhythm: Normal rate.  Pulmonary:     Effort: Pulmonary effort is normal.  Musculoskeletal:       Hands:     Cervical back: Normal range of motion.  Neurological:     Mental Status: He is alert and oriented to person, place, and time.  Psychiatric:        Mood and Affect: Mood normal.        Behavior: Behavior normal.        Thought Content: Thought content normal.        Judgment: Judgment normal.     PROCEDURE NOTE: laceration repair Verbal consent obtained from patient.  Local anesthesia with 2cc Lidocaine 2% with epinephrine.  Wound explored for tendon, ligament damage. Wound scrubbed with soap and water and rinsed. Wound closed with #3 5-0 Prolene (2 horizontal mattress on either side and 1 simple interrupted centrally) sutures.  Wound cleansed and dressed.   Assessment and Plan :   PDMP not reviewed this encounter.  1. Laceration of right middle finger without foreign body without damage to nail, initial encounter   2. Need for Tdap vaccination    Laceration repaired successfully. Wound care reviewed. Recommended Tylenol and/or ibuprofen for pain control. Return-to-clinic precautions discussed, patient verbalized understanding. Otherwise, follow up in 10 days for suture removal. Provided with work restrictions. Counseled patient on potential for adverse effects with medications prescribed/recommended today, ER and return-to-clinic precautions discussed, patient verbalized understanding.    Wallis Bamberg, [place] [birth year] [phone]

## 2022-09-26 NOTE — ED Triage Notes (Signed)
Pt cut right middle on metal tie at work ~6pm-NAD-steady gait

## 2022-09-26 NOTE — Discharge Instructions (Signed)

## 2022-10-12 ENCOUNTER — Ambulatory Visit
Admission: EM | Admit: 2022-10-12 | Discharge: 2022-10-12 | Disposition: A | Discharge status: Home / Self Care | Payer: Self-pay

## 2022-10-12 DIAGNOSIS — Z4802 Encounter for removal of sutures: Secondary | ICD-10-CM

## 2022-10-12 NOTE — ED Triage Notes (Signed)
Pt presents for sutures removal in right middle finger.

## 2023-03-08 ENCOUNTER — Encounter (HOSPITAL_COMMUNITY): Payer: Self-pay

## 2023-03-08 ENCOUNTER — Emergency Department (HOSPITAL_COMMUNITY)
Admission: EM | Admit: 2023-03-08 | Discharge: 2023-03-09 | Disposition: A | Discharge status: Home / Self Care | Payer: Self-pay | Attending: Emergency Medicine | Admitting: Emergency Medicine

## 2023-03-08 ENCOUNTER — Emergency Department (HOSPITAL_COMMUNITY): Payer: Self-pay

## 2023-03-08 ENCOUNTER — Other Ambulatory Visit: Payer: Self-pay

## 2023-03-08 DIAGNOSIS — J181 Lobar pneumonia, unspecified organism: Secondary | ICD-10-CM | POA: Insufficient documentation

## 2023-03-08 DIAGNOSIS — J189 Pneumonia, unspecified organism: Secondary | ICD-10-CM

## 2023-03-08 DIAGNOSIS — J45909 Unspecified asthma, uncomplicated: Secondary | ICD-10-CM | POA: Insufficient documentation

## 2023-03-08 DIAGNOSIS — J09X2 Influenza due to identified novel influenza A virus with other respiratory manifestations: Secondary | ICD-10-CM | POA: Insufficient documentation

## 2023-03-08 DIAGNOSIS — Z20822 Contact with and (suspected) exposure to covid-19: Secondary | ICD-10-CM | POA: Insufficient documentation

## 2023-03-08 DIAGNOSIS — Z7951 Long term (current) use of inhaled steroids: Secondary | ICD-10-CM | POA: Insufficient documentation

## 2023-03-08 DIAGNOSIS — I10 Essential (primary) hypertension: Secondary | ICD-10-CM | POA: Insufficient documentation

## 2023-03-08 DIAGNOSIS — Z79899 Other long term (current) drug therapy: Secondary | ICD-10-CM | POA: Insufficient documentation

## 2023-03-08 DIAGNOSIS — J101 Influenza due to other identified influenza virus with other respiratory manifestations: Secondary | ICD-10-CM

## 2023-03-08 LAB — RESP PANEL BY RT-PCR (RSV, FLU A&B, COVID)  RVPGX2
Influenza A by PCR: POSITIVE — AB
Influenza B by PCR: NEGATIVE
Resp Syncytial Virus by PCR: NEGATIVE
SARS Coronavirus 2 by RT PCR: NEGATIVE

## 2023-03-08 MED ORDER — SODIUM CHLORIDE 0.9 % IV BOLUS
1000.0000 mL | Freq: Once | INTRAVENOUS | Status: AC
  Administered 2023-03-08: 1000 mL via INTRAVENOUS

## 2023-03-08 MED ORDER — AZITHROMYCIN 250 MG PO TABS
500.0000 mg | ORAL_TABLET | Freq: Once | ORAL | Status: AC
  Administered 2023-03-08: 500 mg via ORAL
  Filled 2023-03-08: qty 2

## 2023-03-08 MED ORDER — IPRATROPIUM-ALBUTEROL 0.5-2.5 (3) MG/3ML IN SOLN
3.0000 mL | Freq: Once | RESPIRATORY_TRACT | Status: AC
Start: 2023-03-08 — End: 2023-03-08
  Administered 2023-03-08: 3 mL via RESPIRATORY_TRACT
  Filled 2023-03-08: qty 3

## 2023-03-08 NOTE — ED Triage Notes (Addendum)
C/o flu like symptoms on Tuesday and used Thera flu w/ relief.  Patient reports feeling better but still has dry cough, sob, and chest tightness.  Pt talking in full sentences and ambulatory to triage Hx asthma and home inhaler w/o relief.

## 2023-03-08 NOTE — ED Provider Notes (Incomplete)
[place] EMERGENCY DEPARTMENT AT [facility] Provider Note   CSN: [id number] Arrival date & time: 03/08/23  2131     History {Add pertinent medical, surgical, social history, OB history to HPI:1} Chief Complaint  Patient presents with   Shortness of Breath   Cough    Corey Brooks is a 57 y.o. male who presents with 5 days of nasal congestion, SOB, chest tightness. Hx of asthma, improvement in chest tightness and SOB at home with neb. Fevers, congestions resolved at this time. Persistent cough and shortness of breath prompting ED visit. Also has hx of HTN, on hydrochlorothiazide however has been out of it for a few months. Hx of OSA.   HPI     Home Medications Prior to Admission medications   Medication Sig Start Date End Date Taking? Authorizing Provider  albuterol (PROVENTIL) (2.5 MG/3ML) 0.083% nebulizer solution Take 2.5 mg by nebulization every 6 (six) hours as needed. 04/18/21   [provider]  albuterol (VENTOLIN HFA) 108 (90 Base) MCG/ACT inhaler SMARTSIG:2 Puff(s) Via Inhaler 4 Times Daily PRN 04/05/21   [provider]  budesonide-formoterol (SYMBICORT) 160-4.5 MCG/ACT inhaler Inhale 2 puffs into the lungs 2 (two) times daily. 04/22/21   [provider]  budesonide-formoterol (SYMBICORT) 80-4.5 MCG/ACT inhaler Inhale 2 puffs into the lungs 2 (two) times daily. Patient not taking: Reported on 07/16/2021    [provider]  FLUoxetine (PROZAC) 10 MG tablet Take 10 mg by mouth daily. 06/18/21   [provider]  hydrochlorothiazide (HYDRODIURIL) 12.5 MG tablet Take 12.5 mg by mouth daily.    [provider]  loratadine (CLARITIN) 10 MG tablet Take 10 mg by mouth daily as needed. 05/23/21   [provider]  methocarbamol (ROBAXIN) 500 MG tablet Take 1 tablet (500 mg total) by mouth 2 (two) times daily. 03/17/19   Lorelee New, PA-C  montelukast (SINGULAIR) 10 MG tablet Take 10 mg by mouth at bedtime.  05/23/21   [provider]  SUMAtriptan (IMITREX) 100 MG tablet Take 100 mg by mouth every 2 (two) hours as needed for migraine. May repeat in 2 hours if headache persists or recurs.    [provider]  traZODone (DESYREL) 100 MG tablet Take 100 mg by mouth at bedtime as needed for sleep. 02/23/19   [provider]  Vitamin D, Ergocalciferol, (DRISDOL) 1.25 MG (50000 UNIT) CAPS capsule Take 50,000 Units by mouth once a week. 06/18/21   [provider]      Allergies    Caffeine, Pollen extract, Asa [aspirin], and Cheese    Review of Systems   Review of Systems  Constitutional: Negative.   HENT: Negative.    Respiratory:  Positive for cough, chest tightness and shortness of breath.   Cardiovascular: Negative.   Gastrointestinal: Negative.   Genitourinary: Negative.   Neurological: Negative.     Physical Exam Updated Vital Signs BP (!) 158/107 (BP Location: Left Arm)   Pulse 82   Temp 98.2 F (36.8 C) (Oral)   Resp 16   Ht 6\' 3"  (1.905 m)   Wt 127 kg   SpO2 94%   BMI 35.00 kg/m  Physical Exam Vitals and nursing note reviewed.  Constitutional:      Appearance: He is obese. He is not ill-appearing or toxic-appearing.  HENT:     Head: Normocephalic and atraumatic.     Mouth/Throat:     Mouth: Mucous membranes are moist.     Pharynx: No oropharyngeal exudate or  posterior oropharyngeal erythema.  Eyes:     General: No scleral icterus.       Right eye: No discharge.        Left eye: No discharge.     Conjunctiva/sclera: Conjunctivae normal.  Cardiovascular:     Rate and Rhythm: Normal rate and regular rhythm.     Pulses: Normal pulses.  Pulmonary:     Effort: Pulmonary effort is normal. No respiratory distress.     Breath sounds: Examination of the right-middle field reveals rhonchi. Examination of the left-middle field reveals rhonchi. Examination of the right-lower field reveals wheezing and rhonchi. Examination of the left-lower field  reveals wheezing and rhonchi. Wheezing and rhonchi present. No rales.  Chest:     Chest wall: No mass, tenderness or edema.  Abdominal:     General: Bowel sounds are normal. There is no distension.     Tenderness: There is no abdominal tenderness.  Musculoskeletal:        General: No deformity.     Cervical back: Neck supple.     Right lower leg: No edema.     Left lower leg: No edema.  Skin:    General: Skin is warm and dry.     Capillary Refill: Capillary refill takes less than 2 seconds.  Neurological:     General: No focal deficit present.     Mental Status: He is alert. Mental status is at baseline.  Psychiatric:        Mood and Affect: Mood normal.     ED Results / Procedures / Treatments   Labs (all labs ordered are listed, but only abnormal results are displayed) Labs Reviewed  RESP PANEL BY RT-PCR (RSV, FLU A&B, COVID)  RVPGX2  CULTURE, BLOOD (ROUTINE X 2)  CULTURE, BLOOD (ROUTINE X 2)  CBC WITH DIFFERENTIAL/PLATELET  COMPREHENSIVE METABOLIC PANEL  I-STAT CG4 LACTIC ACID, ED    EKG EKG Interpretation Date/Time:  Sunday March 08 2023 21:51:54 EST Ventricular Rate:  83 PR Interval:  182 QRS Duration:  109 QT Interval:  360 QTC Calculation: 423 R Axis:   22  Text Interpretation: Sinus rhythm Abnormal R-wave progression, early transition Borderline T wave abnormalities Minimal ST elevation, inferior leads Baseline wander in lead(s) II III aVR aVF Confirmed by Kristine Royal [phone]) on 03/08/2023 10:39:34 PM  Radiology DG Chest 2 View Result Date: 03/08/2023 CLINICAL DATA:  COPD, flu-like symptoms. EXAM: CHEST - 2 VIEW COMPARISON:  Chest x-ray 03/17/2019 FINDINGS: There are new mild patchy airspace opacities in both lung bases, right greater than left. There is no pleural effusion or pneumothorax. There is peribronchial thickening bilaterally. Cardiomediastinal silhouette is within normal limits. No acute osseous abnormality. IMPRESSION: 1. New mild patchy  airspace opacities in both lung bases, right greater than left, concerning for pneumonia. 2. Peribronchial thickening bilaterally, which can be seen in the setting of bronchitis. Electronically Signed   By: Darliss Cheney M.D.   On: 03/08/2023 22:00    Procedures Procedures  {Document cardiac monitor, telemetry assessment procedure when appropriate:1}  Medications Ordered in ED Medications  ipratropium-albuterol (DUONEB) 0.5-2.5 (3) MG/3ML nebulizer solution 3 mL (has no administration in time range)  sodium chloride 0.9 % bolus 1,000 mL (has no administration in time range)    ED Course/ Medical Decision Making/ A&P Clinical Course as of 03/08/23 2334  Sun Mar 08, 2023  2334 IV fluids ordered in error, patient did not receive any fluids, as he was in the restroom. ED paramedic notified and fluids  not given.  [RS]    Clinical Course User Index [RS] Deona Novitski, Eugene Gavia, PA-C   {   Click here for ABCD2, HEART and other calculatorsREFRESH Note before signing :1}                              Medical Decision Making 57 y/o male who presents with concern for cough and SOB.   HTN on intake, VS otherwise normal, abdominal exam is benign. Pulmonary exam as above.   DDX includes but is not limited to PNA, Asthma, COPD, bronchitis.   Amount and/or Complexity of Data Reviewed Labs: ordered.    Details: Influenza A + on RVP.  Radiology: ordered.    Details: CXR with bilateral patchy PNA.   Risk Prescription drug management.   Azithro started in the ED.   Breckan voiced understanding of his medical evaluation and treatment plan. Each of their questions answered to their expressed satisfaction.  Return precautions were given.  Patient is well-appearing, stable, and was discharged in good condition.  This chart was dictated using voice recognition software, Dragon. Despite the best efforts of this provider to proofread and correct errors, errors may still occur which can change  documentation meaning.   {Document critical care time when appropriate:1} {Document review of labs and clinical decision tools ie heart score, Chads2Vasc2 etc:1}  {Document your independent review of radiology images, and any outside records:1} {Document your discussion with family members, caretakers, and with consultants:1} {Document social determinants of health affecting pt's care:1} {Document your decision making why or why not admission, treatments were needed:1} Final Clinical Impression(s) / ED Diagnoses Final diagnoses:  None    Rx / DC Orders ED Discharge Orders     None

## 2023-03-09 LAB — CBC WITH DIFFERENTIAL/PLATELET
Abs Immature Granulocytes: 0.02 10*3/uL (ref 0.00–0.07)
Basophils Absolute: 0 10*3/uL (ref 0.0–0.1)
Basophils Relative: 1 %
Eosinophils Absolute: 0.1 10*3/uL (ref 0.0–0.5)
Eosinophils Relative: 3 %
HCT: 41.1 % (ref 39.0–52.0)
Hemoglobin: 13.9 g/dL (ref 13.0–17.0)
Immature Granulocytes: 1 %
Lymphocytes Relative: 52 %
Lymphs Abs: 1.8 10*3/uL (ref 0.7–4.0)
MCH: 31.4 pg (ref 26.0–34.0)
MCHC: 33.8 g/dL (ref 30.0–36.0)
MCV: 92.8 fL (ref 80.0–100.0)
Monocytes Absolute: 0.3 10*3/uL (ref 0.1–1.0)
Monocytes Relative: 9 %
Neutro Abs: 1.2 10*3/uL — ABNORMAL LOW (ref 1.7–7.7)
Neutrophils Relative %: 34 %
Platelets: 169 10*3/uL (ref 150–400)
RBC: 4.43 MIL/uL (ref 4.22–5.81)
RDW: 12.9 % (ref 11.5–15.5)
WBC: 3.3 10*3/uL — ABNORMAL LOW (ref 4.0–10.5)
nRBC: 0 % (ref 0.0–0.2)

## 2023-03-09 LAB — BASIC METABOLIC PANEL
Anion gap: 5 (ref 5–15)
BUN: 11 mg/dL (ref 6–20)
CO2: 28 mmol/L (ref 22–32)
Calcium: 8.6 mg/dL — ABNORMAL LOW (ref 8.9–10.3)
Chloride: 103 mmol/L (ref 98–111)
Creatinine, Ser: 1.16 mg/dL (ref 0.61–1.24)
GFR, Estimated: >60 mL/min (ref >60)
Glucose, Bld: 72 mg/dL (ref 70–99)
Potassium: 4.4 mmol/L (ref 3.5–5.1)
Sodium: 136 mmol/L (ref 135–145)

## 2023-03-09 MED ORDER — ALBUTEROL SULFATE HFA 108 (90 BASE) MCG/ACT IN AERS
2.0000 | INHALATION_SPRAY | Freq: Once | RESPIRATORY_TRACT | Status: AC
  Administered 2023-03-09: 2 via RESPIRATORY_TRACT
  Filled 2023-03-09: qty 6.7

## 2023-03-09 MED ORDER — AZITHROMYCIN 250 MG PO TABS: ORAL_TABLET | ORAL | 0 refills | Status: AC

## 2023-03-09 NOTE — Discharge Instructions (Addendum)
You were seen in the ER today for your cough. You have the flu as well as pneumonia. Please take the prescribed antibiotic for the entire course. Use the provided inhaler as discussed and return to the ER with any new severe symptoms. Follow up with your primary care doctor.

## 2024-01-20 IMAGING — MR MR HEAD W/O CM
12 of 13 series · 44 of 48 positions shown · non-contrast
Comparison: 07/09/2006

CLINICAL DATA: Chronic migraine without Regino

EXAM:
MRI HEAD WITHOUT CONTRAST
TECHNIQUE: Multiplanar, multiecho pulse sequences of the brain and surrounding
structures were obtained without intravenous contrast.

[Series 5: DWI · axial · 3.0mm · 0.88mm/px · z∈[-125,+28]mm · 7 of 104 slices shown (1 of 4)]
[im 1/104]
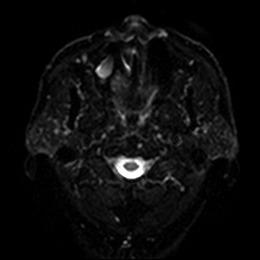
[im 18/104]
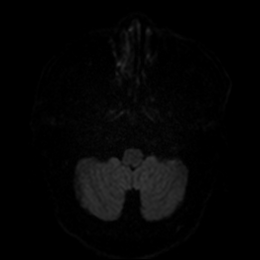
[im 35/104]
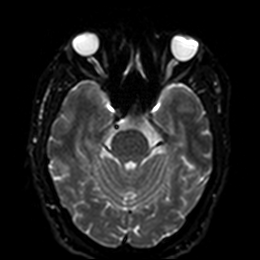
[im 52/104]
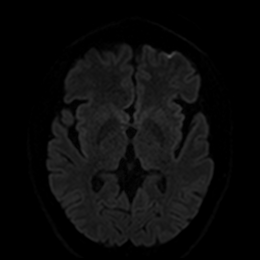
[im 69/104]
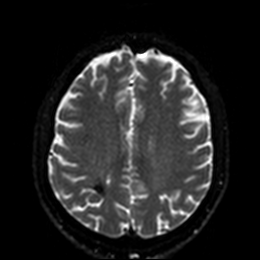
[im 86/104]
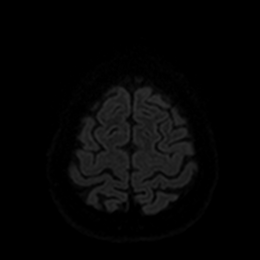
[im 104/104]
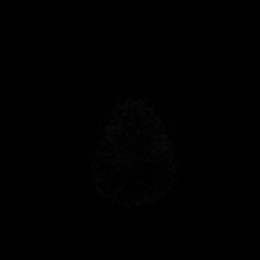

[Series 6: DWI · axial · 3.0mm · 0.88mm/px · z∈[-125,+28]mm · 4 of 52 slices shown (2 of 4)]
[im 1/52]
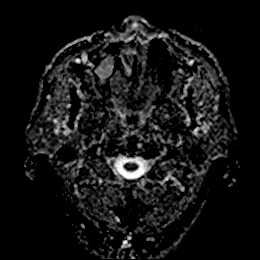
[im 18/52]
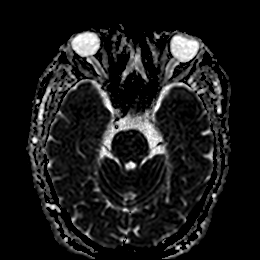
[im 35/52]
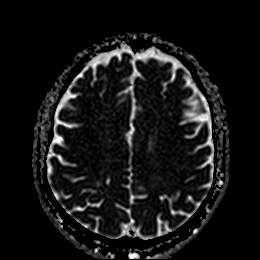
[im 52/52]
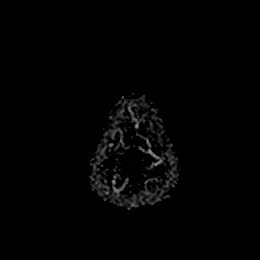

[Series 7: DWI · coronal · 4.0mm · 0.88mm/px · 6 of 76 slices shown (3 of 4)]
[im 1/76]
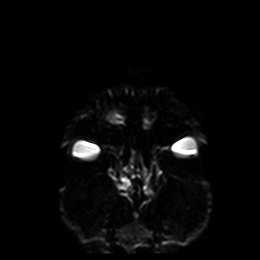
[im 16/76]
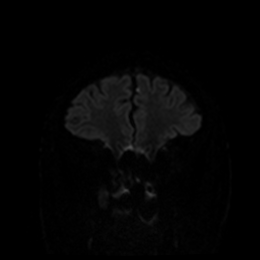
[im 31/76]
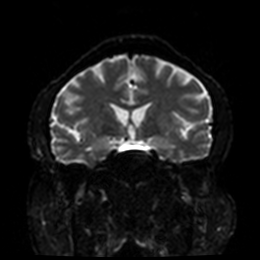
[im 46/76]
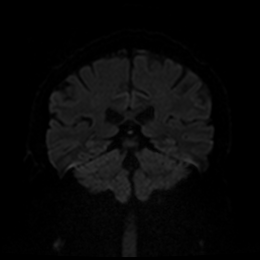
[im 61/76]
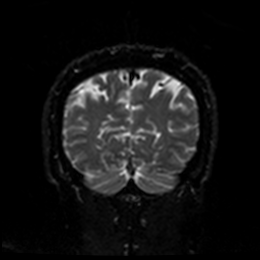
[im 76/76]
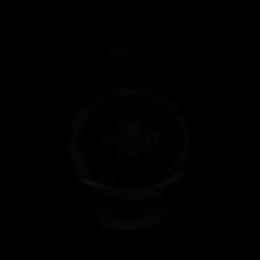

[Series 8: DWI · coronal · 4.0mm · 0.88mm/px · 3 of 38 slices shown (4 of 4)]
[im 1/38]
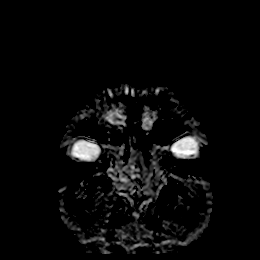
[im 19/38]
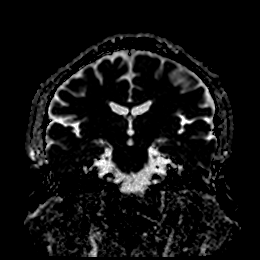
[im 38/38]
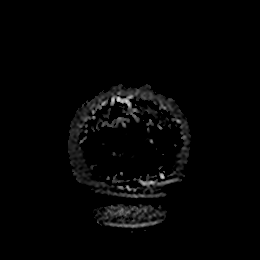

[Series 9: T1 · sagittal · 5.0mm · 0.75mm/px · 2 of 27 slices shown]
[im 1/27]
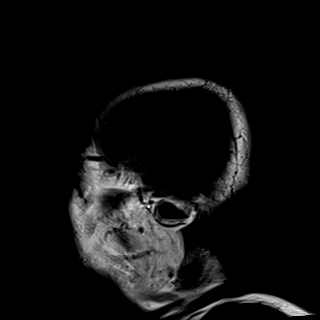
[im 27/27]
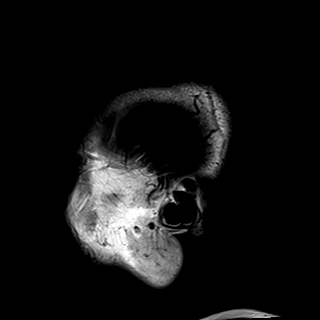

[Series 10: T2 · axial · 5.0mm · 0.72mm/px · z∈[-127,+29]mm · 2 of 27 slices shown (1 of 2)]
[im 1/27]
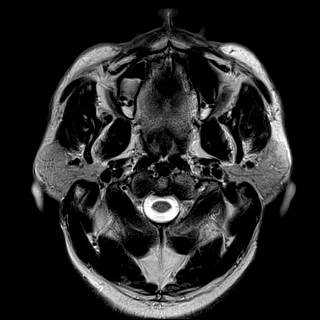
[im 27/27]
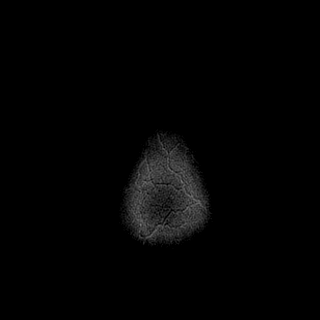

[Series 11: FLAIR · axial · 5.0mm · 0.45mm/px · z∈[-126,+30]mm · 2 of 27 slices shown]
[im 1/27]
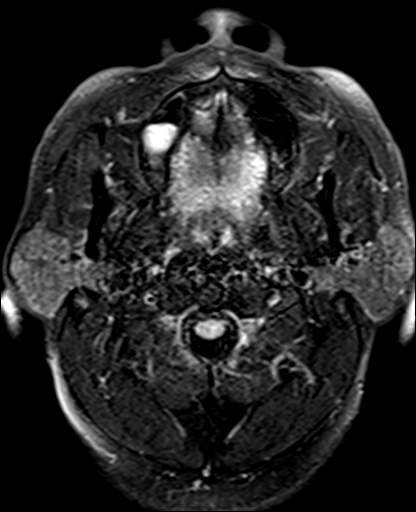
[im 27/27]
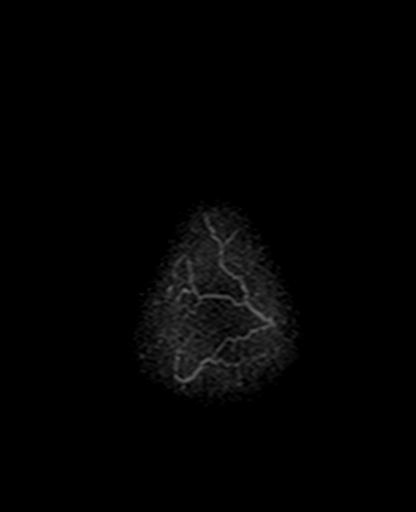

[Series 12: mag_images · axial · 3.0mm · 0.90mm/px · z∈[-131,+34]mm · 4 of 56 slices shown]
[im 1/56]
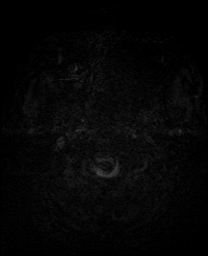
[im 19/56]
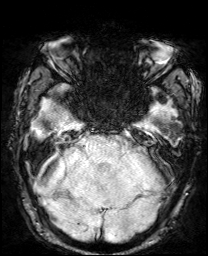
[im 37/56]
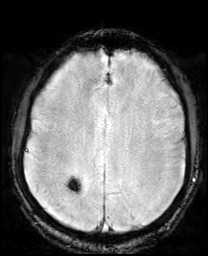
[im 56/56]
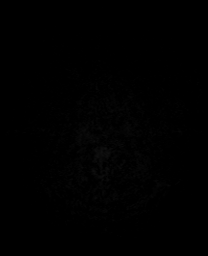

[Series 13: pha_images · axial · 3.0mm · 0.90mm/px · z∈[-131,+31]mm · 4 of 55 slices shown]
[im 1/55]
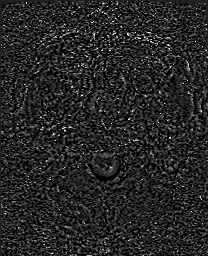
[im 19/55]
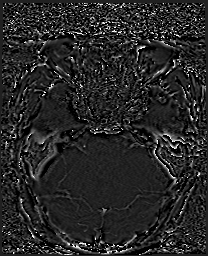
[im 37/55]
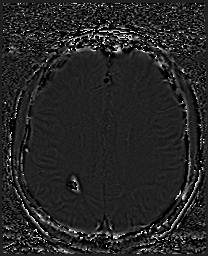
[im 55/55]
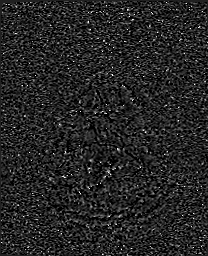

[Series 14: swi_images · axial · 3.0mm · 0.90mm/px · z∈[-131,+34]mm · 4 of 56 slices shown]
[im 1/56]
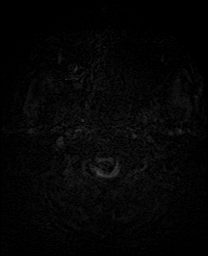
[im 19/56]
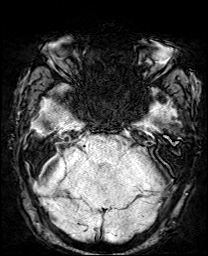
[im 37/56]
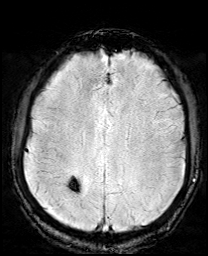
[im 56/56]
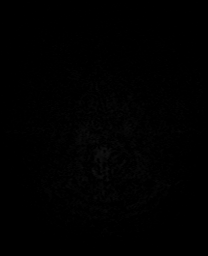

[Series 15: mip_images(sw) · axial · 24.0mm · 0.90mm/px · z∈[-120,+24]mm · 4 of 49 slices shown]
[im 1/49]
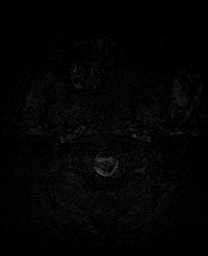
[im 17/49]
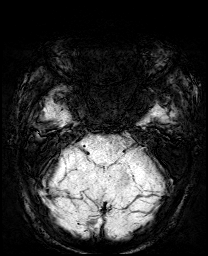
[im 33/49]
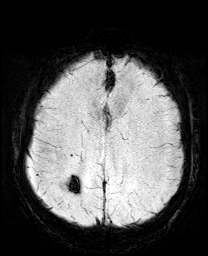
[im 49/49]
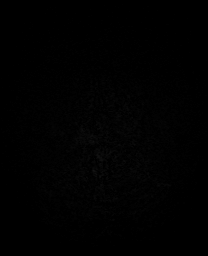

[Series 17: T2 · coronal · 5.0mm · 0.34mm/px · 2 of 31 slices shown (2 of 2)]
[im 1/31]
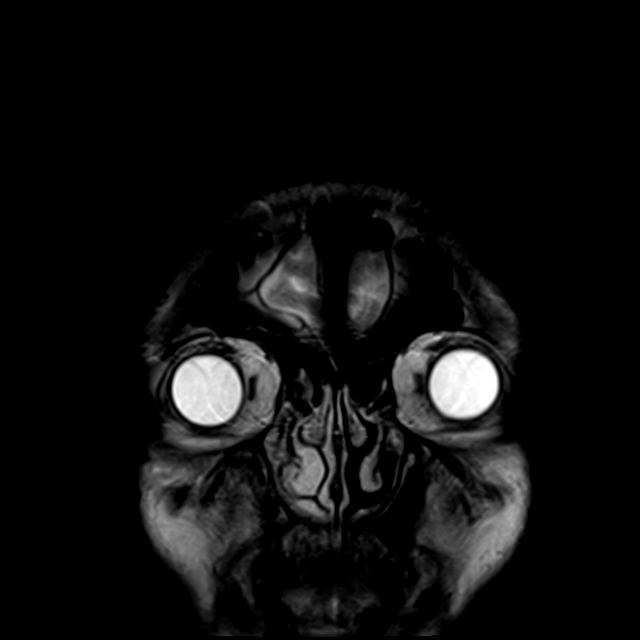
[im 31/31]
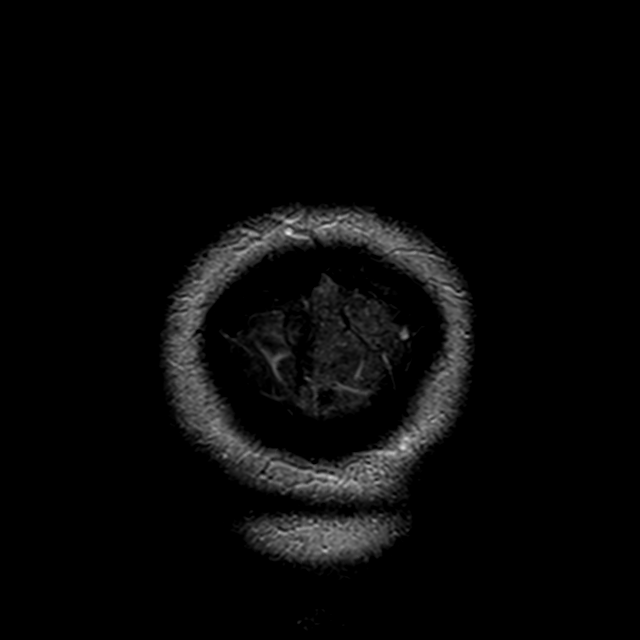

[44 of 48 positions shown; findings below may reference images not displayed]

FINDINGS: Brain: No acute infarction, hemorrhage, hydrocephalus, extra-axial
collection or mass effect. 8 mm nodule in the subcortical right
parietal white matter with popcorn T2 appearance and hemosiderin
staining, stable and consistent with cavernoma. Stable brain volume.
No chronic white matter disease.

Vascular: Major flow voids are preserved

Skull and upper cervical spine: No focal marrow lesion

Sinuses/Orbits: Retention cyst in the inferior right maxillary
sinus, also seen in 4441.
IMPRESSION: 1. Stable compared to 4441. No specific or reversible cause for
headache.
2. Right parietal cavernoma.

## 2024-02-27 ENCOUNTER — Emergency Department (HOSPITAL_COMMUNITY)
Admission: EM | Admit: 2024-02-27 | Discharge: 2024-02-28 | Disposition: A | Payer: Self-pay | Attending: Emergency Medicine | Admitting: Emergency Medicine

## 2024-02-27 DIAGNOSIS — J069 Acute upper respiratory infection, unspecified: Secondary | ICD-10-CM

## 2024-02-27 NOTE — ED Triage Notes (Signed)
 Patient brought in by POV with c/o fever, congestion, migraine and nonproductive cough x 1 week. Patient states he been taken migraine meds and Theraflu.

## 2024-02-28 ENCOUNTER — Other Ambulatory Visit: Payer: Self-pay

## 2024-02-28 ENCOUNTER — Emergency Department (HOSPITAL_COMMUNITY): Payer: Self-pay

## 2024-02-28 LAB — BASIC METABOLIC PANEL WITH GFR
Anion gap: 8 (ref 5–15)
BUN: 8 mg/dL (ref 6–20)
CO2: 29 mmol/L (ref 22–32)
Calcium: 8.3 mg/dL — ABNORMAL LOW (ref 8.9–10.3)
Chloride: 100 mmol/L (ref 98–111)
Creatinine, Ser: 1.1 mg/dL (ref 0.61–1.24)
GFR, Estimated: >60 mL/min (ref >60)
Glucose, Bld: 111 mg/dL — ABNORMAL HIGH (ref 70–99)
Potassium: 3.8 mmol/L (ref 3.5–5.1)
Sodium: 137 mmol/L (ref 135–145)

## 2024-02-28 LAB — PRO BRAIN NATRIURETIC PEPTIDE: Pro Brain Natriuretic Peptide: <50 pg/mL (ref <300.0)

## 2024-02-28 LAB — CBC WITH DIFFERENTIAL/PLATELET
Abs Immature Granulocytes: 0.02 K/uL (ref 0.00–0.07)
Basophils Absolute: 0.1 K/uL (ref 0.0–0.1)
Basophils Relative: 1 %
Eosinophils Absolute: 0.2 K/uL (ref 0.0–0.5)
Eosinophils Relative: 4 %
HCT: 41.6 % (ref 39.0–52.0)
Hemoglobin: 13.5 g/dL (ref 13.0–17.0)
Immature Granulocytes: 0 %
Lymphocytes Relative: 30 %
Lymphs Abs: 1.9 K/uL (ref 0.7–4.0)
MCH: 29.5 pg (ref 26.0–34.0)
MCHC: 32.5 g/dL (ref 30.0–36.0)
MCV: 91 fL (ref 80.0–100.0)
Monocytes Absolute: 0.6 K/uL (ref 0.1–1.0)
Monocytes Relative: 10 %
Neutro Abs: 3.4 K/uL (ref 1.7–7.7)
Neutrophils Relative %: 55 %
Platelets: 232 K/uL (ref 150–400)
RBC: 4.57 MIL/uL (ref 4.22–5.81)
RDW: 13.3 % (ref 11.5–15.5)
WBC: 6.2 K/uL (ref 4.0–10.5)
nRBC: 0 % (ref 0.0–0.2)

## 2024-02-28 LAB — RESP PANEL BY RT-PCR (RSV, FLU A&B, COVID)  RVPGX2
Influenza A by PCR: NEGATIVE
Influenza B by PCR: NEGATIVE
Resp Syncytial Virus by PCR: NEGATIVE
SARS Coronavirus 2 by RT PCR: NEGATIVE

## 2024-02-28 LAB — D-DIMER, QUANTITATIVE: D-Dimer, Quant: 0.41 ug{FEU}/mL (ref 0.00–0.50)

## 2024-02-28 LAB — TROPONIN T, HIGH SENSITIVITY: Troponin T High Sensitivity: <15 ng/L (ref 0–19)

## 2024-02-28 MED ORDER — ACETAMINOPHEN 325 MG PO TABS
650.0000 mg | ORAL_TABLET | Freq: Once | ORAL | Status: AC
  Administered 2024-02-28: 650 mg via ORAL
  Filled 2024-02-28: qty 2

## 2024-02-28 MED ORDER — BENZONATATE 100 MG PO CAPS: 100.0000 mg | ORAL_CAPSULE | Freq: Three times a day (TID) | ORAL | 0 refills | Status: AC | PRN

## 2024-02-28 MED ORDER — DEXAMETHASONE SOD PHOSPHATE PF 10 MG/ML IJ SOLN
10.0000 mg | Freq: Once | INTRAMUSCULAR | Status: AC
  Administered 2024-02-28: 10 mg via INTRAVENOUS

## 2024-02-28 MED ORDER — ALBUTEROL SULFATE (2.5 MG/3ML) 0.083% IN NEBU
5.0000 mg | INHALATION_SOLUTION | Freq: Once | RESPIRATORY_TRACT | Status: AC
  Administered 2024-02-28: 5 mg via RESPIRATORY_TRACT
  Filled 2024-02-28: qty 6

## 2024-02-28 MED ORDER — ALBUTEROL SULFATE HFA 108 (90 BASE) MCG/ACT IN AERS
2.0000 | INHALATION_SPRAY | Freq: Once | RESPIRATORY_TRACT | Status: AC
  Administered 2024-02-28: 2 via RESPIRATORY_TRACT
  Filled 2024-02-28: qty 6.7

## 2024-02-28 NOTE — ED Provider Notes (Signed)
 [place] EMERGENCY DEPARTMENT AT [facility] Provider Note   CSN: [id number] Arrival date & time: 02/27/24  2354     Patient presents with: Nasal Congestion and Cough   Corey Brooks is a 58 y.o. male.   The history is provided by the patient.  Cough Corey Brooks is a 58 y.o. male who presents to the Emergency Department complaining of cough.  He presents the emergency department for evaluation of dry cough for the last 7 days.  Over Thanksgiving he did have a fever and a cough that was productive of mucus.  He took TheraFlu and overall he started feeling better.  His cough turned to a dry cough and now he has associated chest discomfort with coughing as well as pain on breathing and feeling choked at times.  He reports intermittent leg swelling chronically, and this is at his baseline.  He has a history of asthma, hypertension.  No associated vomiting, diarrhea, abdominal pain.  Fevers have resolved.  Prior to Admission medications   Medication Sig Start Date End Date Taking? Authorizing Provider  benzonatate  (TESSALON ) 100 MG capsule Take 1 capsule (100 mg total) by mouth 3 (three) times daily as needed for cough. 02/28/24  Yes Griselda Norris, MD  albuterol  (PROVENTIL ) (2.5 MG/3ML) 0.083% nebulizer solution Take 2.5 mg by nebulization every 6 (six) hours as needed. 04/18/21   [provider]  albuterol  (VENTOLIN  HFA) 108 (90 Base) MCG/ACT inhaler SMARTSIG:2 Puff(s) Via Inhaler 4 Times Daily PRN 04/05/21   [provider]  budesonide-formoterol (SYMBICORT) 160-4.5 MCG/ACT inhaler Inhale 2 puffs into the lungs 2 (two) times daily. 04/22/21   [provider]  budesonide-formoterol (SYMBICORT) 80-4.5 MCG/ACT inhaler Inhale 2 puffs into the lungs 2 (two) times daily. Patient not taking: Reported on 07/16/2021    [provider]  FLUoxetine (PROZAC) 10 MG tablet Take 10 mg by mouth daily. 06/18/21   [provider]   hydrochlorothiazide  (HYDRODIURIL ) 12.5 MG tablet Take 12.5 mg by mouth daily.    [provider]  loratadine (CLARITIN) 10 MG tablet Take 10 mg by mouth daily as needed. 05/23/21   [provider]  methocarbamol  (ROBAXIN ) 500 MG tablet Take 1 tablet (500 mg total) by mouth 2 (two) times daily. 03/17/19   Landy Honora CROME, PA-C  montelukast (SINGULAIR) 10 MG tablet Take 10 mg by mouth at bedtime. 05/23/21   [provider]  SUMAtriptan  (IMITREX ) 100 MG tablet Take 100 mg by mouth every 2 (two) hours as needed for migraine. May repeat in 2 hours if headache persists or recurs.    [provider]  traZODone (DESYREL) 100 MG tablet Take 100 mg by mouth at bedtime as needed for sleep. 02/23/19   [provider]  Vitamin D, Ergocalciferol, (DRISDOL) 1.25 MG (50000 UNIT) CAPS capsule Take 50,000 Units by mouth once a week. 06/18/21   [provider]    Allergies: Caffeine, Pollen extract, Asa [aspirin], and Cheese    Review of Systems  Respiratory:  Positive for cough.   All other systems reviewed and are negative.   Updated Vital Signs BP (!) 145/78   Pulse 77   Temp 98.4 F (36.9 C)   Resp 15   Ht 6' 3 (1.905 m)   Wt (!) 138.3 kg   SpO2 98%   BMI 38.12 kg/m   Physical Exam Vitals and nursing note reviewed.  Constitutional:      Appearance: He is well-developed.  HENT:     Head:  Normocephalic and atraumatic.  Cardiovascular:     Rate and Rhythm: Normal rate and regular rhythm.     Heart sounds: No murmur heard. Pulmonary:     Effort: Pulmonary effort is normal. No respiratory distress.     Breath sounds: Normal breath sounds.  Abdominal:     Palpations: Abdomen is soft.     Tenderness: There is no abdominal tenderness. There is no guarding or rebound.  Musculoskeletal:        General: No tenderness.     Comments: Nonpitting edema to BLE  Skin:    General: Skin is warm and dry.  Neurological:     Mental Status: He is alert  and oriented to person, place, and time.  Psychiatric:        Behavior: Behavior normal.     (all labs ordered are listed, but only abnormal results are displayed) Labs Reviewed  BASIC METABOLIC PANEL WITH GFR - Abnormal; Notable for the following components:      Result Value   Glucose, Bld 111 (*)    Calcium 8.3 (*)    All other components within normal limits  RESP PANEL BY RT-PCR (RSV, FLU A&B, COVID)  RVPGX2  PRO BRAIN NATRIURETIC PEPTIDE  CBC WITH DIFFERENTIAL/PLATELET  D-DIMER, QUANTITATIVE  TROPONIN T, HIGH SENSITIVITY  TROPONIN T, HIGH SENSITIVITY    EKG: EKG Interpretation Date/Time:  Sunday February 28 2024 01:53:45 EST Ventricular Rate:  83 PR Interval:  196 QRS Duration:  88 QT Interval:  311 QTC Calculation: 366 R Axis:   48  Text Interpretation: Sinus rhythm Abnormal R-wave progression, early transition Borderline T wave abnormalities Confirmed by Griselda Norris [phone]) on 02/28/2024 3:00:22 AM  Radiology: DG Chest 2 View Result Date: 02/28/2024 EXAM: 2 VIEW(S) XRAY OF THE CHEST 02/28/2024 12:13:00 AM COMPARISON: 03/08/2023 CLINICAL HISTORY: cough FINDINGS: LUNGS AND PLEURA: No focal pulmonary opacity. No pleural effusion. No pneumothorax. HEART AND MEDIASTINUM: No acute abnormality of the cardiac and mediastinal silhouettes. BONES AND SOFT TISSUES: No acute osseous abnormality. IMPRESSION: 1. No acute cardiopulmonary process. Electronically signed by: Franky Crease MD 02/28/2024 12:17 AM EST RP Workstation: [id number]     Procedures   Medications Ordered in the ED  acetaminophen  (TYLENOL ) tablet 650 mg (has no administration in time range)  albuterol  (PROVENTIL ) (2.5 MG/3ML) 0.083% nebulizer solution 5 mg (5 mg Nebulization Given 02/28/24 0210)  dexamethasone  (DECADRON ) injection 10 mg (10 mg Intravenous Given 02/28/24 0340)  albuterol  (VENTOLIN  HFA) 108 (90 Base) MCG/ACT inhaler 2 puff (2 puffs Inhalation Given 02/28/24 0341)                                     Medical Decision Making Amount and/or Complexity of Data Reviewed Labs: ordered. Radiology: ordered.  Risk OTC drugs. Prescription drug management.   Patient with history of hypertension here for evaluation of cough, had URI symptoms 1 week ago and cough has persisted.  Lungs are clear on examination with no wheezing.  He does have improvement in his symptoms after albuterol  administration.  Given reported lower extremity edema and reported chest pain labs were obtained.  Labs without significant anemia.  BNP, troponin are within normal limits.  EKG is unchanged when compared to priors.  Patient is low risk for DVT/PE, D-dimer is negative.  Current picture is not consistent with DVT or PE.  Current picture is not consistent with decompensated CHF or pneumonia.  Discussed with patient  home care for cough, likely postviral in origin.  Given history of asthma will provide one-time dose of Decadron  for reactive airway component.  Will also provide inhaler.  Discussed outpatient follow-up as well as return precautions.     Final diagnoses:  Viral URI with cough    ED Discharge Orders          Ordered    benzonatate  (TESSALON ) 100 MG capsule  3 times daily PRN        02/28/24 0332               Griselda Norris, MD 02/28/24 [phone]
